# Patient Record
Sex: Male | Born: 1960 | Race: White | Hispanic: No | Marital: Married | Smoking: Former smoker
Health system: Southern US, Community
[De-identification: ages and names within clinical notes are randomized; demographics above are authoritative.]

## PROBLEM LIST (undated history)

## (undated) DIAGNOSIS — E785 Hyperlipidemia, unspecified: Secondary | ICD-10-CM

## (undated) DIAGNOSIS — K219 Gastro-esophageal reflux disease without esophagitis: Secondary | ICD-10-CM

## (undated) DIAGNOSIS — K635 Polyp of colon: Secondary | ICD-10-CM

## (undated) DIAGNOSIS — I2510 Atherosclerotic heart disease of native coronary artery without angina pectoris: Secondary | ICD-10-CM

## (undated) DIAGNOSIS — J302 Other seasonal allergic rhinitis: Secondary | ICD-10-CM

## (undated) DIAGNOSIS — I10 Essential (primary) hypertension: Secondary | ICD-10-CM

## (undated) HISTORY — PX: CLAVICLE SURGERY: SHX598

## (undated) HISTORY — PX: RHINOPLASTY: SUR1284

## (undated) HISTORY — PX: COLONOSCOPY: SHX174

## (undated) HISTORY — DX: Hyperlipidemia, unspecified: E78.5

## (undated) HISTORY — DX: Polyp of colon: K63.5

## (undated) HISTORY — DX: Other seasonal allergic rhinitis: J30.2

## (undated) HISTORY — PX: ANKLE SURGERY: SHX546

## (undated) HISTORY — PX: ROTATOR CUFF REPAIR: SHX139

---

## 2008-12-03 ENCOUNTER — Ambulatory Visit: Payer: Self-pay | Admitting: General Practice

## 2013-01-19 ENCOUNTER — Emergency Department: Payer: Self-pay | Admitting: Emergency Medicine

## 2013-05-21 ENCOUNTER — Ambulatory Visit: Payer: Self-pay | Admitting: Gastroenterology

## 2013-05-24 LAB — PATHOLOGY REPORT

## 2014-10-06 DIAGNOSIS — Z889 Allergy status to unspecified drugs, medicaments and biological substances status: Secondary | ICD-10-CM | POA: Insufficient documentation

## 2016-08-09 ENCOUNTER — Other Ambulatory Visit: Payer: Self-pay | Admitting: Family Medicine

## 2016-08-09 DIAGNOSIS — Z8249 Family history of ischemic heart disease and other diseases of the circulatory system: Secondary | ICD-10-CM

## 2016-08-16 ENCOUNTER — Ambulatory Visit: Payer: Self-pay

## 2016-08-23 ENCOUNTER — Telehealth: Payer: Self-pay

## 2016-08-23 NOTE — Telephone Encounter (Signed)
Patient calling in to verify upcoming stress test tomorrow. Reviewed medications that he is currently taking simvastatin and reflux along with some other supplements. No blood pressure or heart medications. He verbalized understanding to arrive 15 minutes early to register and wear comfortable clothing. He had no further questions at this time and was appreciative for my time.

## 2016-08-24 ENCOUNTER — Ambulatory Visit (INDEPENDENT_AMBULATORY_CARE_PROVIDER_SITE_OTHER): Payer: BLUE CROSS/BLUE SHIELD

## 2016-08-24 DIAGNOSIS — Z8249 Family history of ischemic heart disease and other diseases of the circulatory system: Secondary | ICD-10-CM

## 2016-08-24 LAB — EXERCISE TOLERANCE TEST
CSEPPHR: 146 {beats}/min
Estimated workload: 12 METS
Exercise duration (min): 10 min
Exercise duration (sec): 11 s
MPHR: 164 {beats}/min
Percent HR: 89 %
Rest HR: 58 {beats}/min

## 2018-05-07 ENCOUNTER — Other Ambulatory Visit: Payer: Self-pay

## 2018-05-08 LAB — CMP12+LP+TP+TSH+6AC+PSA+CBC…
A/G RATIO: 1.7 (ref 1.2–2.2)
ALBUMIN: 4.4 g/dL (ref 3.8–4.9)
ALK PHOS: 72 IU/L (ref 39–117)
ALT: 25 IU/L (ref 0–44)
AST: 22 IU/L (ref 0–40)
BASOS ABS: 0.1 10*3/uL (ref 0.0–0.2)
BUN/Creatinine Ratio: 14 (ref 9–20)
BUN: 12 mg/dL (ref 6–24)
Basos: 1 %
Bilirubin Total: 0.2 mg/dL (ref 0.0–1.2)
CHOLESTEROL TOTAL: 168 mg/dL (ref 100–199)
Calcium: 9.3 mg/dL (ref 8.7–10.2)
Chloride: 101 mmol/L (ref 96–106)
Chol/HDL Ratio: 3 ratio (ref 0.0–5.0)
Creatinine, Ser: 0.86 mg/dL (ref 0.76–1.27)
EOS (ABSOLUTE): 0 10*3/uL (ref 0.0–0.4)
EOS: 0 %
Estimated CHD Risk: <0.5 times avg. (ref 0.0–1.0)
FREE THYROXINE INDEX: 1.9 (ref 1.2–4.9)
GFR calc non Af Amer: 96 mL/min/{1.73_m2} (ref >59)
GFR, EST AFRICAN AMERICAN: 110 mL/min/{1.73_m2} (ref >59)
GGT: 16 IU/L (ref 0–65)
Globulin, Total: 2.6 g/dL (ref 1.5–4.5)
Glucose: 114 mg/dL — ABNORMAL HIGH (ref 65–99)
HDL: 56 mg/dL (ref >39)
HEMOGLOBIN: 14.2 g/dL (ref 13.0–17.7)
Hematocrit: 40 % (ref 37.5–51.0)
IRON: 134 ug/dL (ref 38–169)
Immature Grans (Abs): 0 10*3/uL (ref 0.0–0.1)
Immature Granulocytes: 0 %
LDH: 182 IU/L (ref 121–224)
LDL CALC: 99 mg/dL (ref 0–99)
LYMPHS ABS: 1.4 10*3/uL (ref 0.7–3.1)
Lymphs: 18 %
MCH: 32.8 pg (ref 26.6–33.0)
MCHC: 35.5 g/dL (ref 31.5–35.7)
MCV: 92 fL (ref 79–97)
MONOCYTES: 9 %
Monocytes Absolute: 0.7 10*3/uL (ref 0.1–0.9)
Neutrophils Absolute: 5.5 10*3/uL (ref 1.4–7.0)
Neutrophils: 72 %
PLATELETS: 285 10*3/uL (ref 150–450)
PROSTATE SPECIFIC AG, SERUM: 0.7 ng/mL (ref 0.0–4.0)
Phosphorus: 2.4 mg/dL — ABNORMAL LOW (ref 2.8–4.1)
Potassium: 4.2 mmol/L (ref 3.5–5.2)
RBC: 4.33 x10E6/uL (ref 4.14–5.80)
RDW: 12.3 % (ref 11.6–15.4)
Sodium: 138 mmol/L (ref 134–144)
T3 Uptake Ratio: 25 % (ref 24–39)
T4, Total: 7.4 ug/dL (ref 4.5–12.0)
TOTAL PROTEIN: 7 g/dL (ref 6.0–8.5)
TRIGLYCERIDES: 65 mg/dL (ref 0–149)
TSH: 1.01 u[IU]/mL (ref 0.450–4.500)
Uric Acid: 5.8 mg/dL (ref 3.7–8.6)
VLDL CHOLESTEROL CAL: 13 mg/dL (ref 5–40)
WBC: 7.6 10*3/uL (ref 3.4–10.8)

## 2018-09-10 ENCOUNTER — Other Ambulatory Visit: Payer: Self-pay

## 2018-09-10 MED ORDER — PANTOPRAZOLE SODIUM 40 MG PO TBEC: 40.0000 mg | DELAYED_RELEASE_TABLET | Freq: Every day | ORAL | 1 refills | Status: DC

## 2018-10-16 ENCOUNTER — Encounter: Payer: Self-pay | Admitting: Emergency Medicine

## 2018-10-16 ENCOUNTER — Emergency Department: Payer: 59

## 2018-10-16 ENCOUNTER — Other Ambulatory Visit: Payer: Self-pay

## 2018-10-16 ENCOUNTER — Telehealth: Payer: Self-pay | Admitting: Internal Medicine

## 2018-10-16 ENCOUNTER — Emergency Department
Admission: EM | Admit: 2018-10-16 | Discharge: 2018-10-16 | Disposition: A | Discharge status: Home / Self Care | Payer: 59 | Attending: Student in an Organized Health Care Education/Training Program | Admitting: Student in an Organized Health Care Education/Training Program

## 2018-10-16 DIAGNOSIS — I10 Essential (primary) hypertension: Secondary | ICD-10-CM | POA: Insufficient documentation

## 2018-10-16 DIAGNOSIS — R070 Pain in throat: Secondary | ICD-10-CM | POA: Diagnosis not present

## 2018-10-16 DIAGNOSIS — R0602 Shortness of breath: Secondary | ICD-10-CM

## 2018-10-16 DIAGNOSIS — Z87891 Personal history of nicotine dependence: Secondary | ICD-10-CM | POA: Insufficient documentation

## 2018-10-16 DIAGNOSIS — Z20828 Contact with and (suspected) exposure to other viral communicable diseases: Secondary | ICD-10-CM | POA: Diagnosis not present

## 2018-10-16 DIAGNOSIS — I259 Chronic ischemic heart disease, unspecified: Secondary | ICD-10-CM | POA: Insufficient documentation

## 2018-10-16 HISTORY — DX: Gastro-esophageal reflux disease without esophagitis: K21.9

## 2018-10-16 HISTORY — DX: Atherosclerotic heart disease of native coronary artery without angina pectoris: I25.10

## 2018-10-16 HISTORY — DX: Essential (primary) hypertension: I10

## 2018-10-16 LAB — BASIC METABOLIC PANEL
Anion gap: 7 (ref 5–15)
BUN: 14 mg/dL (ref 6–20)
CO2: 26 mmol/L (ref 22–32)
Calcium: 9.4 mg/dL (ref 8.9–10.3)
Chloride: 105 mmol/L (ref 98–111)
Creatinine, Ser: 0.74 mg/dL (ref 0.61–1.24)
GFR calc Af Amer: >60 mL/min (ref >60)
GFR calc non Af Amer: >60 mL/min (ref >60)
Glucose, Bld: 79 mg/dL (ref 70–99)
Potassium: 3.7 mmol/L (ref 3.5–5.1)
Sodium: 138 mmol/L (ref 135–145)

## 2018-10-16 LAB — TROPONIN I (HIGH SENSITIVITY)
Troponin I (High Sensitivity): 2 ng/L (ref <18)
Troponin I (High Sensitivity): <2 ng/L (ref <18)

## 2018-10-16 LAB — CBC
HCT: 44.1 % (ref 39.0–52.0)
Hemoglobin: 14.7 g/dL (ref 13.0–17.0)
MCH: 32.5 pg (ref 26.0–34.0)
MCHC: 33.3 g/dL (ref 30.0–36.0)
MCV: 97.4 fL (ref 80.0–100.0)
Platelets: 269 10*3/uL (ref 150–400)
RBC: 4.53 MIL/uL (ref 4.22–5.81)
RDW: 12.5 % (ref 11.5–15.5)
WBC: 8.5 10*3/uL (ref 4.0–10.5)
nRBC: 0 % (ref 0.0–0.2)

## 2018-10-16 LAB — SARS CORONAVIRUS 2 BY RT PCR (HOSPITAL ORDER, PERFORMED IN ~~LOC~~ HOSPITAL LAB): SARS Coronavirus 2: NEGATIVE

## 2018-10-16 LAB — FIBRIN DERIVATIVES D-DIMER (ARMC ONLY): Fibrin derivatives D-dimer (ARMC): 363.78 ng/mL (FEU) (ref 0.00–499.00)

## 2018-10-16 MED ORDER — ALBUTEROL SULFATE HFA 108 (90 BASE) MCG/ACT IN AERS: 2.0000 | INHALATION_SPRAY | Freq: Four times a day (QID) | RESPIRATORY_TRACT | 0 refills | Status: DC | PRN

## 2018-10-16 MED ORDER — IPRATROPIUM-ALBUTEROL 0.5-2.5 (3) MG/3ML IN SOLN
3.0000 mL | Freq: Once | RESPIRATORY_TRACT | Status: AC
  Administered 2018-10-16: 14:00:00 3 mL via RESPIRATORY_TRACT
  Filled 2018-10-16: qty 3

## 2018-10-16 NOTE — Telephone Encounter (Signed)
Labored breathing started early AM yesterday and got worse as day went on, EMS came O2 100, BP 110/80, BS 128.   He that as of today breathing still labored and feels like he cant catch his breath.   No other sx or exposure. He did go to [employer] last Sat. 10/13/2018, went shopping and wore a mask.   No meds for SX.

## 2018-10-16 NOTE — ED Notes (Signed)
ED Provider at bedside. 

## 2018-10-16 NOTE — Telephone Encounter (Signed)
His sm started yesterday while he was working out. Once he got a fan on him everything subsided. Then he went home and mowed and it all came back. He had to call a Ambulance yestarday because he couldn't breathe and had pressure on his chest and started panicking. EMS did an EKG and they said it was fine. They also checked his sugars, pulse, O2, and BP. They said everything was checking out fine.  Retired Lives with spouse.  In the last 24 hours have you experienced any of these symptoms  Difficulty breathing yes  Nasal congestion no  New cough no  Runny nose no  Shortness of breath yes  New sore throat no  Unexplained body aches no  Nausea or vomiting no  Diarrhea no  Loss of taste or smell no  Fever (temp>100 F/37.8 C) or chills    Exposure:   Have you been in close contact with someone with confirmed diagnosis of COVID or person under going testing in past 14 days?  no   Have you been tested for COVID? If yes date, location, results in known no   Have you been living in the same home as a person with confirmed diagnosis of COVID or a person undergoing testing? (household contact) no   Have you been diagnosed with COVID or are you waiting on test results? no   Have you traveled anywhere in the past 4 weeks? If yes where [facility]

## 2018-10-16 NOTE — Discharge Instructions (Signed)
Follow up with your PCP.  Return for any additional questions or concerns.

## 2018-10-16 NOTE — ED Provider Notes (Signed)
[facility] Emergency Department Provider Note    First MD Initiated Contact with Patient 10/16/18 1308     (approximate)  I have reviewed the triage vital signs and the nursing notes.   HISTORY  Chief Complaint Shortness of Breath    HPI Richard Walton is a 58 y.o. male below listed past medical history presents the ER with several episodes over the past 24 to 36 hours of shortness of breath.  States he was lifting weights yesterday and felt like something was stuck in his throat and that he could not get air.  EMS was called.  States he was feeling like he was having clear his throat.  Symptoms lasted several minutes.  EMS evaluated patient his oxygen level was normal vitals were normal and patient was feeling better so he declined transfer.  States he had 2 or 3 more episodes since then and decided to come to the ER for evaluation.  Was recently traveling to [facility].  Has not been around anyone in direct contact with known COVID-19.  He did not have any nausea or vomiting.  Denies any exposures to any chemicals.  No new foods.  Denied any rash.  No measured fevers.  Did feel like he was going into a panic when he had these symptoms.  Does have a remote smoking history history of bronchitis.  Currently symptom free.    Past Medical History:  Diagnosis Date  . Coronary artery disease   . GERD (gastroesophageal reflux disease)   . Hypertension    No family history on file. History reviewed. No pertinent surgical history. There are no active problems to display for this patient.     Prior to Admission medications   Medication Sig Start Date End Date Taking? Authorizing Provider  albuterol (VENTOLIN HFA) 108 (90 Base) MCG/ACT inhaler Inhale 2 puffs into the lungs every 6 (six) hours as needed for wheezing or shortness of breath. 10/16/18   Merlyn Lot, MD  pantoprazole (PROTONIX) 40 MG tablet Take 1 tablet (40 mg total) by mouth daily. 09/10/18    Towanda Malkin, MD    Allergies Patient has no known allergies.    Social History Social History   Tobacco Use  . Smoking status: Former Smoker    Types: Cigarettes  . Smokeless tobacco: Never Used  Substance Use Topics  . Alcohol use: Yes    Frequency: Never  . Drug use: Yes    Types: Marijuana    Review of Systems Patient denies headaches, rhinorrhea, blurry vision, numbness, shortness of breath, chest pain, edema, cough, abdominal pain, nausea, vomiting, diarrhea, dysuria, fevers, rashes or hallucinations unless otherwise stated above in HPI. ____________________________________________   PHYSICAL EXAM:  VITAL SIGNS: Vitals:   10/16/18 1207 10/16/18 1400  BP: 139/79 130/84  Pulse: (!) 56 (!) 58  Resp: 17 16  Temp: 99 F (37.2 C)   SpO2: 98% 98%    Constitutional: Alert and oriented.  Eyes: Conjunctivae are normal.  Head: Atraumatic. Nose: No congestion/rhinnorhea. Mouth/Throat: Mucous membranes are moist.   Neck: No stridor. Painless ROM.  Cardiovascular: Normal rate, regular rhythm. Grossly normal heart sounds.  Good peripheral circulation. Respiratory: Normal respiratory effort.  No retractions. Lungs CTAB. Gastrointestinal: Soft and nontender. No distention. No abdominal bruits. No CVA tenderness. Genitourinary:  Musculoskeletal: No lower extremity tenderness nor edema.  No joint effusions. Neurologic:  Normal speech and language. No gross focal neurologic deficits are appreciated. No facial droop Skin:  Skin is  warm, dry and intact. No rash noted. Psychiatric: Mood and affect are normal. Speech and behavior are normal.  ____________________________________________   LABS (all labs ordered are listed, but only abnormal results are displayed)  Results for orders placed or performed during the hospital encounter of 10/16/18 (from the past 24 hour(s))  Basic metabolic panel     Status: None   Collection Time: 10/16/18 12:12 PM  Result Value  Ref Range   Sodium 138 135 - 145 mmol/L   Potassium 3.7 3.5 - 5.1 mmol/L   Chloride 105 98 - 111 mmol/L   CO2 26 22 - 32 mmol/L   Glucose, Bld 79 70 - 99 mg/dL   BUN 14 6 - 20 mg/dL   Creatinine, Ser 8.110.74 0.61 - 1.24 mg/dL   Calcium 9.4 8.9 - 91.410.3 mg/dL   GFR calc non Af Amer >60 >60 mL/min   GFR calc Af Amer >60 >60 mL/min   Anion gap 7 5 - 15  CBC     Status: None   Collection Time: 10/16/18 12:12 PM  Result Value Ref Range   WBC 8.5 4.0 - 10.5 K/uL   RBC 4.53 4.22 - 5.81 MIL/uL   Hemoglobin 14.7 13.0 - 17.0 g/dL   HCT 78.244.1 95.639.0 - 21.352.0 %   MCV 97.4 80.0 - 100.0 fL   MCH 32.5 26.0 - 34.0 pg   MCHC 33.3 30.0 - 36.0 g/dL   RDW 08.612.5 57.811.5 - 46.915.5 %   Platelets 269 150 - 400 K/uL   nRBC 0.0 0.0 - 0.2 %  Troponin I (High Sensitivity)     Status: None   Collection Time: 10/16/18 12:12 PM  Result Value Ref Range   Troponin I (High Sensitivity) 2 <18 ng/L  Fibrin derivatives D-Dimer ([facility] only)     Status: None   Collection Time: 10/16/18  1:27 PM  Result Value Ref Range   Fibrin derivatives D-dimer ([facility]) 363.78 0.00 - 499.00 ng/mL (FEU)  SARS Coronavirus 2 [facility](Hospital order, Performed in [facility] hospital lab) Nasopharyngeal Nasopharyngeal Swab     Status: None   Collection Time: 10/16/18  1:27 PM   Specimen: Nasopharyngeal Swab  Result Value Ref Range   SARS Coronavirus 2 NEGATIVE NEGATIVE  Troponin I (High Sensitivity)     Status: None   Collection Time: 10/16/18  2:24 PM  Result Value Ref Range   Troponin I (High Sensitivity) <2 <18 ng/L   ____________________________________________  EKG My review and personal interpretation at Time: 12:03   Indication: sob  Rate: 60  Rhythm: sinus Axis: normal Other: normal intervals, no stemi ____________________________________________  RADIOLOGY  I personally reviewed all radiographic images ordered to evaluate for the above acute complaints and reviewed radiology reports and findings.  These findings were personally discussed  with the patient.  Please see medical record for radiology report.  ____________________________________________   PROCEDURES  Procedure(s) performed:  Procedures    Critical Care performed: no ____________________________________________   INITIAL IMPRESSION / ASSESSMENT AND PLAN / ED COURSE  Pertinent labs & imaging results that were available during my care of the patient were reviewed by me and considered in my medical decision making (see chart for details).   DDX: Asthma, copd, CHF, pna, ptx, malignancy, Pe, anemia   Richard Walton is a 58 y.o. who presents to the ED with was as described above.  Patient well-appearing currently afebrile hemodynamically stable.  EKG shows no evidence of acute ischemia.  Patient reported history of CAD but otherwise  very healthy and active.  Low risk by heart score.  Serial enzymes show essentially undetectable troponin.  This does not seem clinically consistent with ACS.  D-dimer was negative.  And he is low risk by Wells criteria.  Chest x-ray shows no evidence of pneumothorax infiltrates or other abnormality.  He does not have any evidence of uvular edema.  Normal phonation.  Possible bronchospasm.  Abdominal exam is soft and benign.  Patient stable and appropriate for outpatient follow-up.     The patient was evaluated in Emergency Department today for the symptoms described in the history of present illness. He/she was evaluated in the context of the global COVID-19 pandemic, which necessitated consideration that the patient might be at risk for infection with the SARS-CoV-2 virus that causes COVID-19. Institutional protocols and algorithms that pertain to the evaluation of patients at risk for COVID-19 are in a state of rapid change based on information released by regulatory bodies including the CDC and federal and state organizations. These policies and algorithms were followed during the patient's care in the ED.  As part of my medical  decision making, I reviewed the following data within the electronic MEDICAL RECORD NUMBER Nursing notes reviewed and incorporated, Labs reviewed, notes from prior ED visits and [place] Controlled Substance Database   ____________________________________________   FINAL CLINICAL IMPRESSION(S) / ED DIAGNOSES  Final diagnoses:  SOB (shortness of breath)      NEW MEDICATIONS STARTED DURING THIS VISIT:  New Prescriptions   ALBUTEROL (VENTOLIN HFA) 108 (90 BASE) MCG/ACT INHALER    Inhale 2 puffs into the lungs every 6 (six) hours as needed for wheezing or shortness of breath.     Note:  This document was prepared using Dragon voice recognition software and may include unintentional dictation errors.    Willy Eddyobinson, Joni Norrod, MD 10/16/18 1505

## 2018-10-16 NOTE — Telephone Encounter (Signed)
Message reviewed.  Called patient back and he noted he decided to go to the ER at [facility] and I felt that was probably a good idea with the history shared to me (EMS had done some tests on him yesterday when called to his place and were all ok, but his sx's were recurring some today). He is there now and had an ECG done, a CXR and some labs drawn and awaiting results.  Await that evaluation.

## 2018-10-16 NOTE — ED Notes (Signed)
Pt alert and oriented X 4, stable for discharge. RR even and unlabored, color WNL. Discussed discharge instructions and follow up when appropriate. Instructed to follow up with ER for any life threatening symptoms or concerns of patient and/or family.

## 2018-10-16 NOTE — ED Triage Notes (Signed)
Pt in via POV reports shortness of breath x 2 days, some chest tightness also.  Ambulatory to triage, NAD noted at this time.

## 2018-10-17 NOTE — Telephone Encounter (Addendum)
Pt went to Er yesterday and dx was SOB. See note in Epic.   Called pt he states that he is still kinda freaked out about how he is feeling. ER gave him an albuterol inhaler. States he is just taking it easy and trying to over due.    Pt states that ER says that ER mentioned his reflux but he states that he doesn't feel like its his reflux.

## 2018-10-17 NOTE — Telephone Encounter (Signed)
Reviewed ER note.  If symptoms not improving over the next couple days after the ER visit and w/u done (re-assuring), can make a f/u appt to assess (next week ok as will have coverage here in my absence).

## 2018-10-17 NOTE — Telephone Encounter (Signed)
Pt aware.

## 2018-10-19 ENCOUNTER — Telehealth: Payer: Self-pay | Admitting: Internal Medicine

## 2018-10-19 NOTE — Telephone Encounter (Signed)
He said he is feeling burning in his throat and chest. He also said that his heart burn is really bad. ER suggested that he may need to change his reflux meds.

## 2018-10-19 NOTE — Telephone Encounter (Signed)
Spoke with [employer].  States he's having increase in belching.  Discussed eating habits & he uses a lot of hot sauce on food.  Advised to decrease fluid intake prior to bedtime, elevate head with extra pillow or elevate head of bed and decrease caffeine.  Michela Pitcher he will be OK to wait till Monday for evaluation.  Appt scheduled for 10/22/2018 at 10:15am.  AMD

## 2018-10-22 ENCOUNTER — Other Ambulatory Visit: Payer: Self-pay

## 2018-10-22 ENCOUNTER — Encounter: Payer: Self-pay | Admitting: Internal Medicine

## 2018-10-22 ENCOUNTER — Ambulatory Visit: Payer: Self-pay | Admitting: Registered Nurse

## 2018-10-22 VITALS — BP 118/67 | HR 75 | Temp 98.9°F | Resp 14 | Ht 70.0 in | Wt 167.0 lb

## 2018-10-22 DIAGNOSIS — E785 Hyperlipidemia, unspecified: Secondary | ICD-10-CM | POA: Insufficient documentation

## 2018-10-22 DIAGNOSIS — K219 Gastro-esophageal reflux disease without esophagitis: Secondary | ICD-10-CM | POA: Insufficient documentation

## 2018-10-22 MED ORDER — PANTOPRAZOLE SODIUM 40 MG PO TBEC: 40.0000 mg | DELAYED_RELEASE_TABLET | Freq: Two times a day (BID) | ORAL | 0 refills | Status: DC

## 2018-10-22 NOTE — Patient Instructions (Signed)
Bronchospasm, Adult    Bronchospasm is a tightening of the airways going into the lungs. During an episode, it may be harder to breathe. You may cough, and you may make a whistling sound when you breathe (wheeze).  This condition often affects people with asthma.  What are the causes?  This condition is caused by swelling and irritation in the airways. It can be triggered by:  · An infection (common).  · Seasonal allergies.  · An allergic reaction.  · Exercise.  · Irritants. These include pollution, cigarette smoke, strong odors, aerosol sprays, and paint fumes.  · Weather changes. Winds increase molds and pollens in the air. Cold air may cause swelling.  · Stress and emotional upset.  What are the signs or symptoms?  Symptoms of this condition include:  · Wheezing. If the episode was triggered by an allergy, wheezing may start right away or hours later.  · Nighttime coughing.  · Frequent or severe coughing with a simple cold.  · Chest tightness.  · Shortness of breath.  · Decreased ability to exercise.  How is this diagnosed?  This condition is usually diagnosed with a review of your medical history and a physical exam. Tests, such as lung function tests, are sometimes done to look for other conditions. The need for a chest X-ray depends on where the wheezing occurs and whether it is the first time you have wheezed.  How is this treated?  This condition may be treated with:  · Inhaled medicines. These open up the airways and help you breathe. They can be taken with an inhaler or a nebulizer device.  · Corticosteroid medicines. These may be given for severe bronchospasm, usually when it is associated with asthma.  · Avoiding triggers, such as irritants, infection, or allergies.  Follow these instructions at home:  Medicines  · Take over-the-counter and prescription medicines only as told by your health care provider.  · If you need to use an inhaler or nebulizer to take your medicine, ask your health care provider  to explain how to use it correctly. If you were given a spacer, always use it with your inhaler.  Lifestyle  · Reduce the number of triggers in your home. To do this:  ? Change your heating and air conditioning filter at least once a month.  ? Limit your use of fireplaces and wood stoves.  ? Do not smoke. Do not allow smoking in your home.  ? Avoid using perfumes and fragrances.  ? Get rid of pests, such as roaches and mice, and their droppings.  ? Remove any mold from your home.  ? Keep your house clean and dust free. Use unscented cleaning products.  ? Replace carpet with wood, tile, or vinyl flooring. Carpet can trap dander and dust.  ? Use allergy-proof pillows, mattress covers, and box spring covers.  ? Wash bed sheets and blankets every week in hot water. Dry them in a dryer.  ? Use blankets that are made of polyester or cotton.  ? Wash your hands often.  ? Do not allow pets in your bedroom.  · Avoid breathing in cold air when you exercise.  General instructions  · Have a plan for seeking medical care. Know when to call your health care provider and local emergency services, and where to get emergency care.  · Stay up to date on your immunizations.  · When you have an episode of bronchospasm, stay calm. Try to relax and breathe more slowly.  ·   sputum) changes from clear or white to yellow, green, gray, or bloody.  You have a fever.  Your sputum gets thicker. Get help right away if:  Your wheezing and coughing get worse, even after you take your prescribed medicines.  It gets even harder to breathe.  You develop severe chest pain. Summary  Bronchospasm is a tightening of the airways going into the lungs.  During an episode of  bronchospasm, you may have a harder time breathing. You may cough and make a whistling sound when you breathe (wheeze).  Avoid exposure to triggers such as smoke, dust, mold, animal dander, and fragrances.  When you have an episode of bronchospasm, stay calm. Try to relax and breathe more slowly. This information is not intended to replace advice given to you by your health care provider. Make sure you discuss any questions you have with your health care provider. Document Released: 02/17/2003 Document Revised: 01/27/2017 Document Reviewed: 02/11/2016 Elsevier Patient Education  2020 [employer] Inc. Gastroesophageal Reflux Disease, Adult Gastroesophageal reflux (GER) happens when acid from the stomach flows up into the tube that connects the mouth and the stomach (esophagus). Normally, food travels down the esophagus and stays in the stomach to be digested. However, when a person has GER, food and stomach acid sometimes move back up into the esophagus. If this becomes a more serious problem, the person may be diagnosed with a disease called gastroesophageal reflux disease (GERD). GERD occurs when the reflux:  Happens often.  Causes frequent or severe symptoms.  Causes problems such as damage to the esophagus. When stomach acid comes in contact with the esophagus, the acid may cause soreness (inflammation) in the esophagus. Over time, GERD may create small holes (ulcers) in the lining of the esophagus. What are the causes? This condition is caused by a problem with the muscle between the esophagus and the stomach (lower esophageal sphincter, or LES). Normally, the LES muscle closes after food passes through the esophagus to the stomach. When the LES is weakened or abnormal, it does not close properly, and that allows food and stomach acid to go back up into the esophagus. The LES can be weakened by certain dietary substances, medicines, and medical conditions, including:  Tobacco  use.  Pregnancy.  Having a hiatal hernia.  Alcohol use.  Certain foods and beverages, such as coffee, chocolate, onions, and peppermint. What increases the risk? You are more likely to develop this condition if you:  Have an increased body weight.  Have a connective tissue disorder.  Use NSAID medicines. What are the signs or symptoms? Symptoms of this condition include:  Heartburn.  Difficult or painful swallowing.  The feeling of having a lump in the throat.  Abitter taste in the mouth.  Bad breath.  Having a large amount of saliva.  Having an upset or bloated stomach.  Belching.  Chest pain. Different conditions can cause chest pain. Make sure you see your health care provider if you experience chest pain.  Shortness of breath or wheezing.  Ongoing (chronic) cough or a night-time cough.  Wearing away of tooth enamel.  Weight loss. How is this diagnosed? Your health care provider will take a medical history and perform a physical exam. To determine if you have mild or severe GERD, your health care provider may also monitor how you respond to treatment. You may also have tests, including:  A test to examine your stomach and esophagus with a small camera (endoscopy).  A test thatmeasures the acidity level  in your esophagus.  A test thatmeasures how much pressure is on your esophagus.  A barium swallow or modified barium swallow test to show the shape, size, and functioning of your esophagus. How is this treated? The goal of treatment is to help relieve your symptoms and to prevent complications. Treatment for this condition may vary depending on how severe your symptoms are. Your health care provider may recommend:  Changes to your diet.  Medicine.  Surgery. Follow these instructions at home: Eating and drinking   Follow a diet as recommended by your health care provider. This may involve avoiding foods and drinks such as: ? Coffee and tea (with  or without caffeine). ? Drinks that containalcohol. ? Energy drinks and sports drinks. ? Carbonated drinks or sodas. ? Chocolate and cocoa. ? Peppermint and mint flavorings. ? Garlic and onions. ? Horseradish. ? Spicy and acidic foods, including peppers, chili powder, curry powder, vinegar, hot sauces, and barbecue sauce. ? Citrus fruit juices and citrus fruits, such as oranges, lemons, and limes. ? Tomato-based foods, such as red sauce, chili, salsa, and pizza with red sauce. ? Fried and fatty foods, such as donuts, french fries, potato chips, and high-fat dressings. ? High-fat meats, such as hot dogs and fatty cuts of red and white meats, such as rib eye steak, sausage, ham, and bacon. ? High-fat dairy items, such as whole milk, butter, and cream cheese.  Eat small, frequent meals instead of large meals.  Avoid drinking large amounts of liquid with your meals.  Avoid eating meals during the 2-3 hours before bedtime.  Avoid lying down right after you eat.  Do not exercise right after you eat. Lifestyle   Do not use any products that contain nicotine or tobacco, such as cigarettes, e-cigarettes, and chewing tobacco. If you need help quitting, ask your health care provider.  Try to reduce your stress by using methods such as yoga or meditation. If you need help reducing stress, ask your health care provider.  If you are overweight, reduce your weight to an amount that is healthy for you. Ask your health care provider for guidance about a safe weight loss goal. General instructions  Pay attention to any changes in your symptoms.  Take over-the-counter and prescription medicines only as told by your health care provider. Do not take aspirin, ibuprofen, or other NSAIDs unless your health care provider told you to do so.  Wear loose-fitting clothing. Do not wear anything tight around your waist that causes pressure on your abdomen.  Raise (elevate) the head of your bed about 6  inches (15 cm).  Avoid bending over if this makes your symptoms worse.  Keep all follow-up visits as told by your health care provider. This is important. Contact a health care provider if:  You have: ? New symptoms. ? Unexplained weight loss. ? Difficulty swallowing or it hurts to swallow. ? Wheezing or a persistent cough. ? A hoarse voice.  Your symptoms do not improve with treatment. Get help right away if you:  Have pain in your arms, neck, jaw, teeth, or back.  Feel sweaty, dizzy, or light-headed.  Have chest pain or shortness of breath.  Vomit and your vomit looks like blood or coffee grounds.  Faint.  Have stool that is bloody or black.  Cannot swallow, drink, or eat. Summary  Gastroesophageal reflux happens when acid from the stomach flows up into the esophagus. GERD is a disease in which the reflux happens often, causes frequent or severe  symptoms, or causes problems such as damage to the esophagus.  Treatment for this condition may vary depending on how severe your symptoms are. Your health care provider may recommend diet and lifestyle changes, medicine, or surgery.  Contact a health care provider if you have new or worsening symptoms.  Take over-the-counter and prescription medicines only as told by your health care provider. Do not take aspirin, ibuprofen, or other NSAIDs unless your health care provider told you to do so.  Keep all follow-up visits as told by your health care provider. This is important. This information is not intended to replace advice given to you by your health care provider. Make sure you discuss any questions you have with your health care provider. Document Released: 11/24/2004 Document Revised: 08/23/2017 Document Reviewed: 08/23/2017 Elsevier Patient Education  2020 [employer]

## 2018-10-22 NOTE — Progress Notes (Signed)
Subjective:    Patient ID: Richard Walton, male    DOB: 05-Jan-1961, 58 y.o.   MRN: [id number]  57y/o married caucasian male established patient with heartburn worsening on protonix and started doubling his dose last week and added tums afternoons stopped baking soda per recommendation of his neighbor and noticed it is helping with afternoon symptoms "feeling the throat burn" and some increased burping.  Had ambulance call/ED visit due to trouble breathing this month also.  They said everything was fine on eval.  Patient retired stated no stress and he exercises but didn't do squats this week and sometimes notices worsening with pull ups.  Hasn't noticed reflux symptoms at night since started protonix greater than 1 year ago. Likes spicy food/hot sauce it doesn't trigger symptoms but he did notice hasbrowns/eggs/sausage did in the past week. Does wear belt.  Doesn't have HOB elevated.  Denied coughing.  Had covid test negative in the past month.  Likes having mints in the evening.  Due for repeat colonoscopy due to polyps this year last 2015 and was instructed follow up 5 years.  He is going to schedule.  Lost 5 lbs in the past week due to stress.  Formerly in the [employer] before working for the [place] and retiring.  covid 19 pandemic ongoing patient wearing cloth face mask.     Review of Systems  Constitutional: Negative for activity change, appetite change, chills, diaphoresis, fatigue and fever.  HENT: Negative for trouble swallowing and voice change.   Eyes: Negative for photophobia and visual disturbance.  Respiratory: Positive for shortness of breath. Negative for cough, wheezing and stridor.   Cardiovascular: Positive for chest pain. Negative for palpitations and leg swelling.  Gastrointestinal: Negative for abdominal distention, abdominal pain, diarrhea, nausea and vomiting.  Endocrine: Negative for cold intolerance and heat intolerance.  Genitourinary: Negative for difficulty  urinating.  Musculoskeletal: Negative for neck pain and neck stiffness.  Allergic/Immunologic: Negative for food allergies.  Neurological: Negative for dizziness, tremors, seizures, syncope, facial asymmetry, speech difficulty, weakness, light-headedness, numbness and headaches.  Hematological: Negative for adenopathy. Does not bruise/bleed easily.  Psychiatric/Behavioral: Negative for agitation, confusion and sleep disturbance.       Objective:   Physical Exam Vitals signs reviewed.  Constitutional:      General: He is awake.     Appearance: Normal appearance. He is well-developed and well-groomed.  HENT:     Head: Normocephalic and atraumatic.     Jaw: There is normal jaw occlusion.     Right Ear: Hearing and external ear normal.     Left Ear: Hearing and external ear normal.     Nose: Nose normal.     Mouth/Throat:     Lips: Pink. No lesions.     Mouth: Mucous membranes are moist.     Pharynx: Oropharynx is clear. Uvula midline.  Eyes:     General: Lids are normal. No visual field deficit.    Extraocular Movements: Extraocular movements intact.     Conjunctiva/sclera: Conjunctivae normal.     Pupils: Pupils are equal, round, and reactive to light.  Neck:     Musculoskeletal: Normal range of motion and neck supple. Normal range of motion. No edema, erythema, neck rigidity, crepitus, injury, pain with movement, torticollis, spinous process tenderness or muscular tenderness.     Trachea: Trachea normal.  Cardiovascular:     Rate and Rhythm: Normal rate and regular rhythm.     Pulses: Normal pulses.  Radial pulses are 2+ on the right side and 2+ on the left side.     Heart sounds: Normal heart sounds.  Pulmonary:     Effort: Pulmonary effort is normal. No respiratory distress.     Breath sounds: Normal breath sounds and air entry. No stridor, decreased air movement or transmitted upper airway sounds. No decreased breath sounds, wheezing, rhonchi or rales.     Comments:  No cough observed in exam room; spoke full sentences without difficulty; wearing cloth mask in exam room due to covid 19 pandem[place] Chest:     Chest wall: No tenderness.  Abdominal:     General: Abdomen is flat. Bowel sounds are decreased. There is no distension.     Palpations: Abdomen is soft. There is no shifting dullness, fluid wave, hepatomegaly, splenomegaly, mass or pulsatile mass.     Tenderness: There is no abdominal tenderness. There is no right CVA tenderness, left CVA tenderness, guarding or rebound. Negative signs include Murphy's sign.     Hernia: No hernia is present. There is no hernia in the umbilical area or ventral area.     Comments: Dull to percussion x 4 quads; hypoactive bowel sounds x 4 quads  Musculoskeletal: Normal range of motion.     Right shoulder: Normal.     Left shoulder: Normal.     Right elbow: Normal.    Left elbow: Normal.     Right hip: Normal.     Left hip: Normal.     Right knee: Normal.     Left knee: Normal.     Cervical back: Normal.     Thoracic back: Normal.     Lumbar back: Normal.     Right hand: Normal.     Left hand: Normal.     Right lower leg: No edema.     Left lower leg: No edema.     Comments: Standing/sitting/supine and reverse without difficulty or pain  Lymphadenopathy:     Head:     Right side of head: No submental, submandibular, tonsillar, preauricular, posterior auricular or occipital adenopathy.     Left side of head: No submental, submandibular, tonsillar, preauricular, posterior auricular or occipital adenopathy.     Cervical: No cervical adenopathy.     Right cervical: No superficial cervical adenopathy.    Left cervical: No superficial cervical adenopathy.  Skin:    General: Skin is warm and dry.     Capillary Refill: Capillary refill takes less than 2 seconds.     Coloration: Skin is not ashen, cyanotic, jaundiced, mottled, pale or sallow.     Findings: No abrasion, abscess, acne, bruising, burn, ecchymosis,  erythema, signs of injury, laceration, lesion, petechiae, rash or wound.     Nails: There is no clubbing.   Neurological:     General: No focal deficit present.     Mental Status: He is alert and oriented to person, place, and time. Mental status is at baseline.     GCS: GCS eye subscore is 4. GCS verbal subscore is 5. GCS motor subscore is 6.     Cranial Nerves: Cranial nerves are intact. No cranial nerve deficit, dysarthria or facial asymmetry.     Sensory: Sensation is intact. No sensory deficit.     Motor: Motor function is intact. No weakness, tremor, atrophy, abnormal muscle tone or seizure activity.     Coordination: Coordination is intact. Coordination normal.     Gait: Gait is intact. Gait normal.     Comments:  Gait sure and steady in hallway; in/out of chair without difficulty  Psychiatric:        Attention and Perception: Attention and perception normal.        Mood and Affect: Mood and affect normal.        Speech: Speech normal.        Behavior: Behavior normal. Behavior is cooperative.        Thought Content: Thought content normal.        Cognition and Memory: Cognition and memory normal.        Judgment: Judgment normal.           Assessment & Plan:  A-GERD   P-Per up to date will increase protonix to 40mg  po BID #60 RF0 for refractory GERD.  Discussed since symptoms in the afternoon change from 06/1698 dosing due to 1hr half life of protonix to after breakfast to help cover afternoon better.  If no improvement will consider switching to esomeprazole or omeprazole.  Consider magnesium level as none on file in the past year.  Discussed with patient acid from stomach can cause bronchospasms/cough symptoms in addition to throat burning also.  Discussed to cut down use of peppermints as they can worsen sphincter leakage/reflux sx.  The pathophysiology of reflux is discussed. Exitcare handout on esophageal reflux given to patient. Anti-reflux measures such as raising the  head of the bed, avoiding tight clothing or belts, avoiding eating late at night and not lying down shortly after mealtime and achieving weight loss were discussed. Avoid ASA, NSAID's, caffeine, peppermints, alcohol and tobacco. OTC H2 blockers and/or antacids (Tums 1 po prn max 8gm per 24 hours) are often very helpful for PRN use. Discussed if still requiring daily tums in 2-3 weeks he should notify us and will probably require change in his PPI.  However, for chronic or daily symptoms, prescription strength H2 blockers or a trial of PPI's should be used. Patient should alert me if there are persistent symptoms, dysphagia, weight loss or GI bleeding (bright red or black). Follow up with clinic provider in 1 month if no improvement in symptoms or PCM if symptoms persist despite therapy.  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  Consider EGD if worsening sx especially worsening throat pain, changes in voice, weight loss, dyspnea despite plan of care Patient verbalized agreement and understanding of treatment plan and had no further questions at this time. P2: Diet, Food avoidance that aggravate condition, and Fitness

## 2018-10-23 ENCOUNTER — Telehealth: Payer: Self-pay

## 2018-10-23 NOTE — Telephone Encounter (Signed)
Richard Walton called stating he went to [facility] in [place] to try to pick up the Rx for Protonix 2 tabs/day that you wrote yesterday.  States Pharmacist told him that "insurance says he's had the max amount he can have for the year & can fill the Rx until 08/2019".  States prior Rx for 1 tab/day & gets a 90 day supply, so he has some but eventually will run out taking 2 per day.  The Pharmacist didn't give him any guidance as to whether a Prior Authorization needs to be completed.  [facility] patients insurance changed from [facility] to [place] on 08/29/2018.  Please advise.  AMD

## 2018-10-23 NOTE — Telephone Encounter (Signed)
Please have patient take protonix increased BID dosing 40mg  x 2 weeks then notify me if helping if not we will switch to another medication (PPI covered by his insurance) at that time.  If he does not have enough pills for two weeks trial please notify me also.

## 2018-10-24 NOTE — Telephone Encounter (Signed)
Spoke with [employer].  Gave him Tina's recommendation to use Protonix 40 mg bid x 2 wks & to call us back & let us know if it's helping or not helping.  States last time he picked up Rx he had a 90 day supply & thinks he has enough to last him the two weeks.  If helping will have resolve with insurance/pharmacy and if not helping consider switching to something else that insurance covers.  Verbalized understanding.  AMD

## 2018-10-24 NOTE — Telephone Encounter (Signed)
Noted agree

## 2018-10-25 ENCOUNTER — Other Ambulatory Visit: Payer: Self-pay

## 2018-10-25 ENCOUNTER — Emergency Department
Admission: EM | Admit: 2018-10-25 | Discharge: 2018-10-25 | Disposition: A | Discharge status: Home / Self Care | Payer: 59 | Attending: Student in an Organized Health Care Education/Training Program | Admitting: Student in an Organized Health Care Education/Training Program

## 2018-10-25 ENCOUNTER — Emergency Department: Payer: 59

## 2018-10-25 DIAGNOSIS — R0789 Other chest pain: Secondary | ICD-10-CM | POA: Diagnosis not present

## 2018-10-25 DIAGNOSIS — Z7982 Long term (current) use of aspirin: Secondary | ICD-10-CM | POA: Insufficient documentation

## 2018-10-25 DIAGNOSIS — Z79899 Other long term (current) drug therapy: Secondary | ICD-10-CM | POA: Diagnosis not present

## 2018-10-25 DIAGNOSIS — I2510 Atherosclerotic heart disease of native coronary artery without angina pectoris: Secondary | ICD-10-CM | POA: Insufficient documentation

## 2018-10-25 DIAGNOSIS — R0602 Shortness of breath: Secondary | ICD-10-CM | POA: Diagnosis not present

## 2018-10-25 DIAGNOSIS — Z87891 Personal history of nicotine dependence: Secondary | ICD-10-CM | POA: Diagnosis not present

## 2018-10-25 DIAGNOSIS — R221 Localized swelling, mass and lump, neck: Secondary | ICD-10-CM | POA: Diagnosis present

## 2018-10-25 DIAGNOSIS — R070 Pain in throat: Secondary | ICD-10-CM | POA: Diagnosis not present

## 2018-10-25 DIAGNOSIS — I10 Essential (primary) hypertension: Secondary | ICD-10-CM | POA: Insufficient documentation

## 2018-10-25 DIAGNOSIS — Z20828 Contact with and (suspected) exposure to other viral communicable diseases: Secondary | ICD-10-CM | POA: Diagnosis not present

## 2018-10-25 LAB — COMPREHENSIVE METABOLIC PANEL
ALT: 24 U/L (ref 0–44)
AST: 24 U/L (ref 15–41)
Albumin: 4.4 g/dL (ref 3.5–5.0)
Alkaline Phosphatase: 61 U/L (ref 38–126)
Anion gap: 8 (ref 5–15)
BUN: 15 mg/dL (ref 6–20)
CO2: 30 mmol/L (ref 22–32)
Calcium: 9.7 mg/dL (ref 8.9–10.3)
Chloride: 103 mmol/L (ref 98–111)
Creatinine, Ser: 0.69 mg/dL (ref 0.61–1.24)
GFR calc Af Amer: >60 mL/min (ref >60)
GFR calc non Af Amer: >60 mL/min (ref >60)
Glucose, Bld: 127 mg/dL — ABNORMAL HIGH (ref 70–99)
Potassium: 3.7 mmol/L (ref 3.5–5.1)
Sodium: 141 mmol/L (ref 135–145)
Total Bilirubin: 0.8 mg/dL (ref 0.3–1.2)
Total Protein: 8.2 g/dL — ABNORMAL HIGH (ref 6.5–8.1)

## 2018-10-25 LAB — TROPONIN I (HIGH SENSITIVITY)
Troponin I (High Sensitivity): 2 ng/L (ref <18)
Troponin I (High Sensitivity): 3 ng/L (ref <18)

## 2018-10-25 LAB — CBC WITH DIFFERENTIAL/PLATELET
Abs Immature Granulocytes: 0.03 10*3/uL (ref 0.00–0.07)
Basophils Absolute: 0.1 10*3/uL (ref 0.0–0.1)
Basophils Relative: 1 %
Eosinophils Absolute: 0 10*3/uL (ref 0.0–0.5)
Eosinophils Relative: 0 %
HCT: 44.6 % (ref 39.0–52.0)
Hemoglobin: 15.3 g/dL (ref 13.0–17.0)
Immature Granulocytes: 0 %
Lymphocytes Relative: 17 %
Lymphs Abs: 1.5 10*3/uL (ref 0.7–4.0)
MCH: 32.6 pg (ref 26.0–34.0)
MCHC: 34.3 g/dL (ref 30.0–36.0)
MCV: 95.1 fL (ref 80.0–100.0)
Monocytes Absolute: 0.7 10*3/uL (ref 0.1–1.0)
Monocytes Relative: 8 %
Neutro Abs: 6.8 10*3/uL (ref 1.7–7.7)
Neutrophils Relative %: 74 %
Platelets: 260 10*3/uL (ref 150–400)
RBC: 4.69 MIL/uL (ref 4.22–5.81)
RDW: 12.2 % (ref 11.5–15.5)
WBC: 9.2 10*3/uL (ref 4.0–10.5)
nRBC: 0 % (ref 0.0–0.2)

## 2018-10-25 MED ORDER — LORAZEPAM 1 MG PO TABS: 1.0000 mg | ORAL_TABLET | Freq: Three times a day (TID) | ORAL | 0 refills | Status: AC | PRN

## 2018-10-25 MED ORDER — LORAZEPAM 2 MG/ML IJ SOLN
1.0000 mg | Freq: Once | INTRAMUSCULAR | Status: AC
  Administered 2018-10-25: 1 mg via INTRAVENOUS
  Filled 2018-10-25: qty 1

## 2018-10-25 MED ORDER — IOHEXOL 350 MG/ML SOLN
75.0000 mL | Freq: Once | INTRAVENOUS | Status: AC | PRN
  Administered 2018-10-25: 10:00:00 75 mL via INTRAVENOUS

## 2018-10-25 NOTE — ED Provider Notes (Signed)
[facility] Emergency Department Provider Note    First MD Initiated Contact with Patient 10/25/18 [id number]     (approximate)  I have reviewed the triage vital signs and the nursing notes.   HISTORY  Chief Complaint Oral Swelling    HPI Richard Walton is a 58 y.o. male below listed past medical history presenting to the ER for evaluation of persistent episodes of feeling that his throat is tightening up and that he cannot breathe.  States he gets sweaty palms and feet with this.  Feels very anxious.  He is able to eat and drink during these episodes.  No nausea or vomiting..  No radiation of the pain.  Does have a history of gastritis and did recently increase his Protonix but does not feel any better.  Was actually in follow-up for outpatient clinic today but started having the discomfort and came to the ER for evaluation.    Past Medical History:  Diagnosis Date   Colon polyp    Coronary artery disease    GERD (gastroesophageal reflux disease)    Hyperlipidemia    Hypertension    Family History  Problem Relation Age of Onset   Heart disease Mother    Kidney disease Father    Heart disease Brother    Past Surgical History:  Procedure Laterality Date   ANKLE SURGERY     CLAVICLE SURGERY     COLONOSCOPY     RHINOPLASTY     ROTATOR CUFF REPAIR Bilateral    Patient Active Problem List   Diagnosis Date Noted   GERD (gastroesophageal reflux disease) 10/22/2018   HLD (hyperlipidemia) 10/22/2018      Prior to Admission medications   Medication Sig Start Date End Date Taking? Authorizing Provider  albuterol (VENTOLIN HFA) 108 (90 Base) MCG/ACT inhaler Inhale 2 puffs into the lungs every 6 (six) hours as needed for wheezing or shortness of breath. 10/16/18   Willy Eddy, MD  amLODipine (NORVASC) 10 MG tablet Take 10 mg by mouth daily. 09/22/18   [provider]  aspirin EC 81 MG tablet Take 81 mg by mouth daily.    [provider]  Calcium Carbonate Antacid (TUMS PO) Take by mouth.    [provider]  fluticasone (FLONASE) 50 MCG/ACT nasal spray Place into the nose.    [provider]  LORazepam (ATIVAN) 1 MG tablet Take 1 tablet (1 mg total) by mouth every 8 (eight) hours as needed for anxiety. 10/25/18 10/25/19  Willy Eddy, MD  Multiple Vitamin (MULTIVITAMIN) capsule Take 1 capsule by mouth daily.    [provider]  pantoprazole (PROTONIX) 40 MG tablet Take 1 tablet (40 mg total) by mouth 2 (two) times daily. 10/22/18 11/21/18  Betancourt, Jarold Song, NP  simvastatin (ZOCOR) 20 MG tablet Take by mouth.    [provider]    Allergies Patient has no known allergies.    Social History Social History   Tobacco Use   Smoking status: Former Smoker    Types: Cigarettes   Smokeless tobacco: Never Used  Substance Use Topics   Alcohol use: Yes    Frequency: Never   Drug use: Yes    Types: Marijuana    Review of Systems Patient denies headaches, rhinorrhea, blurry vision, numbness, shortness of breath, chest pain, edema, cough, abdominal pain, nausea, vomiting, diarrhea, dysuria, fevers, rashes or hallucinations unless otherwise stated above in HPI. ____________________________________________   PHYSICAL EXAM:  VITAL SIGNS: Vitals:   10/25/18  0930 10/25/18 1100  BP: (!) 152/87 125/82  Pulse: 62 60  Resp: 14 14  Temp:    SpO2: 94% 98%    Constitutional: Alert and oriented. No respiratory distress Eyes: Conjunctivae are normal.  Head: Atraumatic. Nose: No congestion/rhinnorhea. Mouth/Throat: Mucous membranes are moist.   Neck: No stridor. Painless ROM.  Cardiovascular: Normal rate, regular rhythm. Grossly normal heart sounds.  Good peripheral circulation. Respiratory: Normal respiratory effort.  No retractions. Lungs CTAB. Gastrointestinal: Soft and nontender. No distention. No abdominal bruits. No CVA tenderness. Genitourinary:    Musculoskeletal: No lower extremity tenderness nor edema.  No joint effusions. Neurologic:  Normal speech and language. No gross focal neurologic deficits are appreciated. No facial droop Skin:  Skin is warm, dry and intact. No rash noted. Psychiatric: Mood and affect are normal. Speech and behavior are normal.  ____________________________________________   LABS (all labs ordered are listed, but only abnormal results are displayed)  Results for orders placed or performed during the hospital encounter of 10/25/18 (from the past 24 hour(s))  CBC with Differential/Platelet     Status: None   Collection Time: 10/25/18  9:27 AM  Result Value Ref Range   WBC 9.2 4.0 - 10.5 K/uL   RBC 4.69 4.22 - 5.81 MIL/uL   Hemoglobin 15.3 13.0 - 17.0 g/dL   HCT 16.144.6 09.639.0 - 04.552.0 %   MCV 95.1 80.0 - 100.0 fL   MCH 32.6 26.0 - 34.0 pg   MCHC 34.3 30.0 - 36.0 g/dL   RDW 40.912.2 81.111.5 - 91.415.5 %   Platelets 260 150 - 400 K/uL   nRBC 0.0 0.0 - 0.2 %   Neutrophils Relative % 74 %   Neutro Abs 6.8 1.7 - 7.7 K/uL   Lymphocytes Relative 17 %   Lymphs Abs 1.5 0.7 - 4.0 K/uL   Monocytes Relative 8 %   Monocytes Absolute 0.7 0.1 - 1.0 K/uL   Eosinophils Relative 0 %   Eosinophils Absolute 0.0 0.0 - 0.5 K/uL   Basophils Relative 1 %   Basophils Absolute 0.1 0.0 - 0.1 K/uL   Immature Granulocytes 0 %   Abs Immature Granulocytes 0.03 0.00 - 0.07 K/uL  Comprehensive metabolic panel     Status: Abnormal   Collection Time: 10/25/18  9:27 AM  Result Value Ref Range   Sodium 141 135 - 145 mmol/L   Potassium 3.7 3.5 - 5.1 mmol/L   Chloride 103 98 - 111 mmol/L   CO2 30 22 - 32 mmol/L   Glucose, Bld 127 (H) 70 - 99 mg/dL   BUN 15 6 - 20 mg/dL   Creatinine, Ser 7.820.69 0.61 - 1.24 mg/dL   Calcium 9.7 8.9 - 95.610.3 mg/dL   Total Protein 8.2 (H) 6.5 - 8.1 g/dL   Albumin 4.4 3.5 - 5.0 g/dL   AST 24 15 - 41 U/L   ALT 24 0 - 44 U/L   Alkaline Phosphatase 61 38 - 126 U/L   Total Bilirubin 0.8 0.3 - 1.2 mg/dL   GFR calc non  Af Amer >60 >60 mL/min   GFR calc Af Amer >60 >60 mL/min   Anion gap 8 5 - 15  Troponin I (High Sensitivity)     Status: None   Collection Time: 10/25/18  9:27 AM  Result Value Ref Range   Troponin I (High Sensitivity) 2 <18 ng/L  Troponin I (High Sensitivity)     Status: None   Collection Time: 10/25/18 11:43 AM  Result Value Ref Range  Troponin I (High Sensitivity) 3 <18 ng/L   ____________________________________________  EKG My review and personal interpretation at Time: 9:22   Indication: throat tightening  Rate: 60  Rhythm: sinus Axis: normal Other: normal intervlas, no stemi ____________________________________________  RADIOLOGY  I personally reviewed all radiographic images ordered to evaluate for the above acute complaints and reviewed radiology reports and findings.  These findings were personally discussed with the patient.  Please see medical record for radiology report.  ____________________________________________   PROCEDURES  Procedure(s) performed:  Procedures    Critical Care performed: no ____________________________________________   INITIAL IMPRESSION / ASSESSMENT AND PLAN / ED COURSE  Pertinent labs & imaging results that were available during my care of the patient were reviewed by me and considered in my medical decision making (see chart for details).   DDX: ACS, pericarditis, esophagitis, boerhaaves, pe, dissection, pna, bronchitis, costochondritis   Richard Walton is a 58 y.o. who presents to the ED with symptoms as described above.  Patient very anxious appearing on exam.  Afebrile hemodynamically stable without any hypoxia.  Initial EKG shows no evidence of ischemia.  Blood work and CT imaging will be ordered to evaluate for by differential.  Clinical Course as of Oct 25 1243  Thu Oct 25, 2018  1240 Patient reassessed.  Felt significant improvement after Ativan.  Repeat troponin is negative.  Again I do not believe this is consistent with  ACS.  Possible esophageal spasm given his history of reflux but patient is tolerating oral hydration.  There is no evidence of dissection.  Not consistent with PE.  Lower suspicion for bronchospasm infectious process.  No evidence of pneumonia.  His oropharynx is clear and moist.  No evidence of foreign body or erythema.  Could be a large component of anxiety or panic attacks.  At this point do believe he stable and appropriate for outpatient follow-up.   [PR]    Clinical Course User Index [PR] Merlyn Lot, MD    The patient was evaluated in Emergency Department today for the symptoms described in the history of present illness. He/she was evaluated in the context of the global COVID-19 pandemic, which necessitated consideration that the patient might be at risk for infection with the SARS-CoV-2 virus that causes COVID-19. Institutional protocols and algorithms that pertain to the evaluation of patients at risk for COVID-19 are in a state of rapid change based on information released by regulatory bodies including the CDC and federal and state organizations. These policies and algorithms were followed during the patient's care in the ED.  As part of my medical decision making, I reviewed the following data within the [place] notes reviewed and incorporated, Labs reviewed, notes from prior ED visits and [place] Controlled Substance Database   ____________________________________________   FINAL CLINICAL IMPRESSION(S) / ED DIAGNOSES  Final diagnoses:  Throat discomfort      NEW MEDICATIONS STARTED DURING THIS VISIT:  New Prescriptions   LORAZEPAM (ATIVAN) 1 MG TABLET    Take 1 tablet (1 mg total) by mouth every 8 (eight) hours as needed for anxiety.     Note:  This document was prepared using Dragon voice recognition software and may include unintentional dictation errors.    Merlyn Lot, MD 10/25/18 1245

## 2018-10-25 NOTE — ED Triage Notes (Signed)
Pt sent over from [facility] with c/o throat swelling, pt was seen here for the same thing 8/18, states it feels like there is something stuck in his throat. Pt is in NAD, respirations WNL.

## 2018-10-25 NOTE — ED Notes (Signed)
Pt taken to CT at this time.

## 2018-10-25 NOTE — Discharge Instructions (Addendum)
Please follow-up with PCP and GI.  Return for any additional questions or concerns.

## 2018-10-25 NOTE — ED Notes (Signed)
Pt returned from CT at this time. Pt ambulated to the bathroom and back to room, this RN notified by CT tech, this RN hooked patient back up to monitor. PT A&O x4, NAD noted.

## 2018-10-25 NOTE — ED Notes (Signed)
Pt states his wife will be able to come and get him.

## 2018-10-30 DIAGNOSIS — I10 Essential (primary) hypertension: Secondary | ICD-10-CM | POA: Insufficient documentation

## 2018-10-30 DIAGNOSIS — R0602 Shortness of breath: Secondary | ICD-10-CM | POA: Diagnosis not present

## 2018-10-30 DIAGNOSIS — I700 Atherosclerosis of aorta: Secondary | ICD-10-CM | POA: Insufficient documentation

## 2018-10-30 DIAGNOSIS — E785 Hyperlipidemia, unspecified: Secondary | ICD-10-CM | POA: Diagnosis not present

## 2018-10-31 DIAGNOSIS — Z23 Encounter for immunization: Secondary | ICD-10-CM | POA: Diagnosis not present

## 2018-10-31 DIAGNOSIS — K219 Gastro-esophageal reflux disease without esophagitis: Secondary | ICD-10-CM | POA: Diagnosis not present

## 2018-10-31 DIAGNOSIS — R0602 Shortness of breath: Secondary | ICD-10-CM | POA: Diagnosis not present

## 2018-10-31 DIAGNOSIS — R1314 Dysphagia, pharyngoesophageal phase: Secondary | ICD-10-CM | POA: Diagnosis not present

## 2018-10-31 DIAGNOSIS — J301 Allergic rhinitis due to pollen: Secondary | ICD-10-CM | POA: Diagnosis not present

## 2018-11-08 ENCOUNTER — Other Ambulatory Visit: Payer: Self-pay | Admitting: Gastroenterology

## 2018-11-08 DIAGNOSIS — Z86010 Personal history of colonic polyps: Secondary | ICD-10-CM | POA: Diagnosis not present

## 2018-11-08 DIAGNOSIS — E785 Hyperlipidemia, unspecified: Secondary | ICD-10-CM | POA: Diagnosis not present

## 2018-11-08 DIAGNOSIS — R1314 Dysphagia, pharyngoesophageal phase: Secondary | ICD-10-CM | POA: Diagnosis not present

## 2018-11-08 DIAGNOSIS — R0602 Shortness of breath: Secondary | ICD-10-CM | POA: Diagnosis not present

## 2018-11-09 ENCOUNTER — Other Ambulatory Visit
Admission: RE | Admit: 2018-11-09 | Discharge: 2018-11-09 | Disposition: A | Discharge status: Home / Self Care | Payer: 59 | Source: Ambulatory Visit | Attending: Internal Medicine | Admitting: Internal Medicine

## 2018-11-09 ENCOUNTER — Other Ambulatory Visit: Payer: Self-pay

## 2018-11-09 ENCOUNTER — Ambulatory Visit
Admission: RE | Admit: 2018-11-09 | Discharge: 2018-11-09 | Disposition: A | Discharge status: Home / Self Care | Payer: 59 | Source: Ambulatory Visit | Attending: Gastroenterology | Admitting: Gastroenterology

## 2018-11-09 DIAGNOSIS — R1310 Dysphagia, unspecified: Secondary | ICD-10-CM | POA: Diagnosis not present

## 2018-11-09 DIAGNOSIS — R1314 Dysphagia, pharyngoesophageal phase: Secondary | ICD-10-CM | POA: Diagnosis not present

## 2018-11-09 DIAGNOSIS — R0602 Shortness of breath: Secondary | ICD-10-CM | POA: Diagnosis not present

## 2018-11-09 DIAGNOSIS — Z20828 Contact with and (suspected) exposure to other viral communicable diseases: Secondary | ICD-10-CM | POA: Insufficient documentation

## 2018-11-10 LAB — SARS CORONAVIRUS 2 (TAT 6-24 HRS): SARS Coronavirus 2: NEGATIVE

## 2018-11-12 DIAGNOSIS — E785 Hyperlipidemia, unspecified: Secondary | ICD-10-CM | POA: Diagnosis not present

## 2018-11-12 DIAGNOSIS — I700 Atherosclerosis of aorta: Secondary | ICD-10-CM | POA: Diagnosis not present

## 2018-11-12 DIAGNOSIS — I10 Essential (primary) hypertension: Secondary | ICD-10-CM | POA: Diagnosis not present

## 2018-11-12 DIAGNOSIS — R0602 Shortness of breath: Secondary | ICD-10-CM | POA: Diagnosis not present

## 2018-11-15 DIAGNOSIS — R1310 Dysphagia, unspecified: Secondary | ICD-10-CM | POA: Diagnosis not present

## 2018-11-15 DIAGNOSIS — I10 Essential (primary) hypertension: Secondary | ICD-10-CM | POA: Diagnosis not present

## 2018-11-15 DIAGNOSIS — D123 Benign neoplasm of transverse colon: Secondary | ICD-10-CM | POA: Diagnosis not present

## 2018-11-15 DIAGNOSIS — Z86010 Personal history of colonic polyps: Secondary | ICD-10-CM | POA: Diagnosis not present

## 2018-11-15 DIAGNOSIS — K5730 Diverticulosis of large intestine without perforation or abscess without bleeding: Secondary | ICD-10-CM | POA: Diagnosis not present

## 2018-11-15 DIAGNOSIS — K640 First degree hemorrhoids: Secondary | ICD-10-CM | POA: Diagnosis not present

## 2018-11-15 DIAGNOSIS — K635 Polyp of colon: Secondary | ICD-10-CM | POA: Diagnosis not present

## 2018-11-19 DIAGNOSIS — I10 Essential (primary) hypertension: Secondary | ICD-10-CM | POA: Diagnosis not present

## 2018-11-19 DIAGNOSIS — R0602 Shortness of breath: Secondary | ICD-10-CM | POA: Diagnosis not present

## 2018-11-19 DIAGNOSIS — I700 Atherosclerosis of aorta: Secondary | ICD-10-CM | POA: Diagnosis not present

## 2018-11-19 DIAGNOSIS — E785 Hyperlipidemia, unspecified: Secondary | ICD-10-CM | POA: Diagnosis not present

## 2018-11-22 ENCOUNTER — Other Ambulatory Visit: Payer: Self-pay

## 2018-11-22 DIAGNOSIS — K219 Gastro-esophageal reflux disease without esophagitis: Secondary | ICD-10-CM

## 2018-11-22 MED ORDER — PANTOPRAZOLE SODIUM 40 MG PO TBEC: 40.0000 mg | DELAYED_RELEASE_TABLET | Freq: Two times a day (BID) | ORAL | 0 refills | Status: DC

## 2018-11-27 DIAGNOSIS — Z79899 Other long term (current) drug therapy: Secondary | ICD-10-CM | POA: Diagnosis not present

## 2018-11-28 ENCOUNTER — Ambulatory Visit: Payer: Self-pay | Admitting: Internal Medicine

## 2018-11-28 ENCOUNTER — Other Ambulatory Visit: Payer: Self-pay

## 2018-11-28 VITALS — BP 150/94 | HR 76 | Temp 97.5°F

## 2018-11-28 DIAGNOSIS — Z860100 Personal history of colon polyps, unspecified: Secondary | ICD-10-CM

## 2018-11-28 DIAGNOSIS — K219 Gastro-esophageal reflux disease without esophagitis: Secondary | ICD-10-CM

## 2018-11-28 DIAGNOSIS — R070 Pain in throat: Secondary | ICD-10-CM

## 2018-11-28 DIAGNOSIS — I10 Essential (primary) hypertension: Secondary | ICD-10-CM

## 2018-11-28 DIAGNOSIS — E7849 Other hyperlipidemia: Secondary | ICD-10-CM

## 2018-11-28 NOTE — Progress Notes (Signed)
S -Patient waiting several minutes after visit scheduled to start in lobby and is first visit in afternoon and I went myself and roomed.   Patient is a 58 year old male who was last seen in this clinic August 24 by a colleague and diagnosed with worsening reflux.  His Protonix was increased to 40 mg twice a day with the timing altered some for benefit and also strict reflux precautions reviewed.  He then went to see an internist at [facility] clinic on 8/27, and was evaluated then in the emergency room for pressure in upper throat,feeling of not moving air,like there is a blockage,worse with exertion.  The work-up ruled out an acute cardiac event, with follow-up consultations scheduled.  He was seen by cardiology October 30, 2018 on consultation, with recommendations to increase his simvastatin to 40 mg daily and a stress echo study was ordered and patient noted was all good. He was not having any degree of chest pain noted at that time.  He was seen by gastroenterology November 08, 2018 with a barium swallow ordered and completed on September 11 and that was negative.  He was to continue the meds. A colonoscopy and upper endoscopy was also ordered, and he noted he had a polyp on the colonoscopy and the upper was good. He returned to taking the protonix to once daily and has been good with no GERD sx's.  He saw ENT as well and had a scope up his nose and no concerning findings per patient.He noted the flovent and azelastine nasal spray were added by ENT and he takes twice daily.  He presents today as a one-month follow-up that was scheduled after his visit here on August 24.  He notes his original sx's of feeling like phlegm in his throat can occur from time to time still, but he no longer panics and it goes away. Has not been problematic in the recent past. He denies any recent CP's, palp's, SOB and no LE swelling. No abdominal pains.  He is active and working out and not limited at all at  present.  He brought his meds with him to review Included:  Norvasc - 10mg  daily Simvastatin - 40mg  daily Protonix - 40mg  daily Flonase nasal spray daily Albuterol inh prn Flovent - bid Azelastine nasal spray - 0.1% bid  No Known Allergies   Former smoker   O - NAD, masked  VS - 144/88 manually by myself on right, recheck from initial of 150/94 by machine, P - 76, T - 97.5  HEENT - sclera anicteric Car - RRR without m/g/r Pulm - CTA Abd - soft, NT to palpation Ext - no LE edema Affect was not flat, approp with conversation  Ass/Plan 1. Throat discomfort, like phlegm getting stuck in throat at times, difficulty then catching his breath noted, seen in ER and work-up to date included the above procedures and no findings of marked concern noted to date as possible source. Noted doing well as not panic when occurs and anxiety likely played a role as well noted. Seen here in late August with GERD concern and that is better and back on once daily protonix  Cont once daily protonix and GERD precautions Cont on statin for increased lipids Has ativan to use prn and not using in recent past and agreed with not using this at present Not feel further referrals nor procedures needed at present and he was very much in agreement with this at present as we closely monitor his clinical status  Staying active important and he plans to Noted his BP was borderline high today on recheck and need to closely monitor presently.  Will cont the other above meds presently F/u as rec'ed with the specialists he has seen to date and F/u here again in 4-6 weeks at the latest, sooner prn I did inform him today that his f/u with not be with me as I will not be available at this practice to see patients after this week.

## 2018-11-29 ENCOUNTER — Telehealth: Payer: Self-pay

## 2018-11-29 NOTE — Telephone Encounter (Signed)
You left a note asking if patient had scheduled his colonoscopy as discussed at his 08/13/2018 physical with Dr. Roxan Hockey.  Richard Walton states had upper & lower on 11/15/2018.  AMD

## 2018-12-26 ENCOUNTER — Encounter: Payer: Self-pay | Admitting: Registered Nurse

## 2018-12-26 ENCOUNTER — Ambulatory Visit: Payer: Self-pay | Admitting: Registered Nurse

## 2018-12-26 ENCOUNTER — Other Ambulatory Visit: Payer: Self-pay

## 2018-12-26 VITALS — BP 130/70 | HR 76 | Temp 98.9°F | Resp 12 | Ht 70.0 in | Wt 191.0 lb

## 2018-12-26 DIAGNOSIS — I10 Essential (primary) hypertension: Secondary | ICD-10-CM

## 2018-12-26 DIAGNOSIS — K219 Gastro-esophageal reflux disease without esophagitis: Secondary | ICD-10-CM

## 2018-12-26 MED ORDER — PANTOPRAZOLE SODIUM 40 MG PO TBEC: 40.0000 mg | DELAYED_RELEASE_TABLET | Freq: Every day | ORAL | 5 refills | Status: DC

## 2018-12-26 NOTE — Patient Instructions (Signed)
Stress Stress is a normal reaction to life events. Stress is what you feel when life demands more than you are used to, or more than you think you can handle. Some stress can be useful, such as studying for a test or meeting a deadline at work. Stress that occurs too often or for too long can cause problems. It can affect your emotional health and interfere with relationships and normal daily activities. Too much stress can weaken your body's defense system (immune system) and increase your risk for physical illness. If you already have a medical problem, stress can make it worse. What are the causes? All sorts of life events can cause stress. An event that causes stress for one person may not be stressful for another person. Major life events, whether positive or negative, commonly cause stress. Examples include:  Losing a job or starting a new job.  Losing a loved one.  Moving to a new town or home.  Getting married or divorced.  Having a baby.  Injury or illness. Less obvious life events can also cause stress, especially if they occur day after day or in combination with each other. Examples include:  Working long hours.  Driving in traffic.  Caring for children.  Being in debt.  Being in a difficult relationship. What are the signs or symptoms? Stress can cause emotional symptoms, including:  Anxiety. This is feeling worried, afraid, on edge, overwhelmed, or out of control.  Anger, including irritation or impatience.  Depression. This is feeling sad, down, helpless, or guilty.  Trouble focusing, remembering, or making decisions. Stress can cause physical symptoms, including:  Aches and pains. These may affect your head, neck, back, stomach, or other areas of your body.  Tight muscles or a clenched jaw.  Low energy.  Trouble sleeping. Stress can cause unhealthy behaviors, including:  Eating to feel better (overeating) or skipping meals.  Working too much or  putting off tasks.  Smoking, drinking alcohol, or using drugs to feel better. How is this diagnosed? Stress is diagnosed through an assessment by your health care provider. He or she may diagnose this condition based on:  Your symptoms and any stressful life events.  Your medical history.  Tests to rule out other causes of your symptoms. Depending on your condition, your health care provider may refer you to a specialist for further evaluation. How is this treated?  Stress management techniques are the recommended treatment for stress. Medicine is not typically recommended for the treatment of stress. Techniques to reduce your reaction to stressful life events include:  Stress identification. Monitor yourself for symptoms of stress and identify what causes stress for you. These skills may help you to avoid or prepare for stressful events.  Time management. Set your priorities, keep a calendar of events, and learn to say "no." Taking these actions can help you avoid making too many commitments. Techniques for coping with stress include:  Rethinking the problem. Try to think realistically about stressful events rather than ignoring them or overreacting. Try to find the positives in a stressful situation rather than focusing on the negatives.  Exercise. Physical exercise can release both physical and emotional tension. The key is to find a form of exercise that you enjoy and do it regularly.  Relaxation techniques. These relax the body and mind. The key is to find one or more that you enjoy and use the technique(s) regularly. Examples include: ? Meditation, deep breathing, or progressive relaxation techniques. ? Yoga or tai chi. ?  Biofeedback, mindfulness techniques, or journaling. ? Listening to music, being out in nature, or participating in other hobbies.  Practicing a healthy lifestyle. Eat a balanced diet, drink plenty of water, limit or avoid caffeine, and get plenty of sleep.   Having a strong support network. Spend time with family, friends, or other people you enjoy being around. Express your feelings and talk things over with someone you trust. Counseling or talk therapy with a mental health professional may be helpful if you are having trouble managing stress on your own. Follow these instructions at home: Lifestyle   Avoid drugs.  Do not use any products that contain nicotine or tobacco, such as cigarettes and e-cigarettes. If you need help quitting, ask your health care provider.  Limit alcohol intake to no more than 1 drink a day for nonpregnant women and 2 drinks a day for men. One drink equals 12 oz of beer, 5 oz of wine, or 1 oz of hard liquor.  Do not use alcohol or drugs to relax.  Eat a balanced diet that includes fresh fruits and vegetables, whole grains, lean meats, fish, eggs, and beans, and low-fat dairy. Avoid processed foods and foods high in added fat, sugar, and salt.  Exercise at least 30 minutes on 5 or more days each week.  Get 7-8 hours of sleep each night. General instructions   Practice stress management techniques as discussed with your health care provider.  Drink enough fluid to keep your urine clear or pale yellow.  Take over-the-counter and prescription medicines only as told by your health care provider.  Keep all follow-up visits as told by your health care provider. This is important. Contact a health care provider if:  Your symptoms get worse.  You have new symptoms.  You feel overwhelmed by your problems and can no longer manage them on your own. Get help right away if:  You have thoughts of hurting yourself or others. If you ever feel like you may hurt yourself or others, or have thoughts about taking your own life, get help right away. You can go to your nearest emergency department or call:  Your local emergency services (911 in the [place]  A suicide crisis helpline, such as the [place] at 559-083-6211. This is open 24 hours a day. Summary  Stress is a normal reaction to life events. It can cause problems if it happens too often or for too long.  Practicing stress management techniques is the best way to treat stress.  Counseling or talk therapy with a mental health professional may be helpful if you are having trouble managing stress on your own. This information is not intended to replace advice given to you by your health care provider. Make sure you discuss any questions you have with your health care provider. Document Released: 08/10/2000 Document Revised: 01/27/2017 Document Reviewed: 04/06/2016 Elsevier Patient Education  2020 [employer]. Managing Your Hypertension Hypertension is commonly called high blood pressure. This is when the force of your blood pressing against the walls of your arteries is too strong. Arteries are blood vessels that carry blood from your heart throughout your body. Hypertension forces the heart to work harder to pump blood, and may cause the arteries to become narrow or stiff. Having untreated or uncontrolled hypertension can cause heart attack, stroke, kidney disease, and other problems. What are blood pressure readings? A blood pressure reading consists of a higher number over a lower number. Ideally, your blood pressure should be  below 120/80. The first ("top") number is called the systolic pressure. It is a measure of the pressure in your arteries as your heart beats. The second ("bottom") number is called the diastolic pressure. It is a measure of the pressure in your arteries as the heart relaxes. What does my blood pressure reading mean? Blood pressure is classified into four stages. Based on your blood pressure reading, your health care provider may use the following stages to determine what type of treatment you need, if any. Systolic pressure and diastolic pressure are measured in a unit called mm Hg. Normal  Systolic  pressure: below 120.  Diastolic pressure: below 80. Elevated  Systolic pressure: 161-096.  Diastolic pressure: below 80. Hypertension stage 1  Systolic pressure: 045-409.  Diastolic pressure: 81-19. Hypertension stage 2  Systolic pressure: 147 or above.  Diastolic pressure: 90 or above. What health risks are associated with hypertension? Managing your hypertension is an important responsibility. Uncontrolled hypertension can lead to:  A heart attack.  A stroke.  A weakened blood vessel (aneurysm).  Heart failure.  Kidney damage.  Eye damage.  Metabolic syndrome.  Memory and concentration problems. What changes can I make to manage my hypertension? Hypertension can be managed by making lifestyle changes and possibly by taking medicines. Your health care provider will help you make a plan to bring your blood pressure within a normal range. Eating and drinking   Eat a diet that is high in fiber and potassium, and low in salt (sodium), added sugar, and fat. An example eating plan is called the DASH (Dietary Approaches to Stop Hypertension) diet. To eat this way: ? Eat plenty of fresh fruits and vegetables. Try to fill half of your plate at each meal with fruits and vegetables. ? Eat whole grains, such as whole wheat pasta, brown rice, or whole grain bread. Fill about one quarter of your plate with whole grains. ? Eat low-fat diary products. ? Avoid fatty cuts of meat, processed or cured meats, and poultry with skin. Fill about one quarter of your plate with lean proteins such as fish, chicken without skin, beans, eggs, and tofu. ? Avoid premade and processed foods. These tend to be higher in sodium, added sugar, and fat.  Reduce your daily sodium intake. Most people with hypertension should eat less than 1,500 mg of sodium a day.  Limit alcohol intake to no more than 1 drink a day for nonpregnant women and 2 drinks a day for men. One drink equals 12 oz of beer, 5 oz of  wine, or 1 oz of hard liquor. Lifestyle  Work with your health care provider to maintain a healthy body weight, or to lose weight. Ask what an ideal weight is for you.  Get at least 30 minutes of exercise that causes your heart to beat faster (aerobic exercise) most days of the week. Activities may include walking, swimming, or biking.  Include exercise to strengthen your muscles (resistance exercise), such as weight lifting, as part of your weekly exercise routine. Try to do these types of exercises for 30 minutes at least 3 days a week.  Do not use any products that contain nicotine or tobacco, such as cigarettes and e-cigarettes. If you need help quitting, ask your health care provider.  Control any long-term (chronic) conditions you have, such as high cholesterol or diabetes. Monitoring  Monitor your blood pressure at home as told by your health care provider. Your personal target blood pressure may vary depending on your medical  conditions, your age, and other factors.  Have your blood pressure checked regularly, as often as told by your health care provider. Working with your health care provider  Review all the medicines you take with your health care provider because there may be side effects or interactions.  Talk with your health care provider about your diet, exercise habits, and other lifestyle factors that may be contributing to hypertension.  Visit your health care provider regularly. Your health care provider can help you create and adjust your plan for managing hypertension. Will I need medicine to control my blood pressure? Your health care provider may prescribe medicine if lifestyle changes are not enough to get your blood pressure under control, and if:  Your systolic blood pressure is 130 or higher.  Your diastolic blood pressure is 80 or higher. Take medicines only as told by your health care provider. Follow the directions carefully. Blood pressure medicines must  be taken as prescribed. The medicine does not work as well when you skip doses. Skipping doses also puts you at risk for problems. Contact a health care provider if:  You think you are having a reaction to medicines you have taken.  You have repeated (recurrent) headaches.  You feel dizzy.  You have swelling in your ankles.  You have trouble with your vision. Get help right away if:  You develop a severe headache or confusion.  You have unusual weakness or numbness, or you feel faint.  You have severe pain in your chest or abdomen.  You vomit repeatedly.  You have trouble breathing. Summary  Hypertension is when the force of blood pumping through your arteries is too strong. If this condition is not controlled, it may put you at risk for serious complications.  Your personal target blood pressure may vary depending on your medical conditions, your age, and other factors. For most people, a normal blood pressure is less than 120/80.  Hypertension is managed by lifestyle changes, medicines, or both. Lifestyle changes include weight loss, eating a healthy, low-sodium diet, exercising more, and limiting alcohol. This information is not intended to replace advice given to you by your health care provider. Make sure you discuss any questions you have with your health care provider. Document Released: 11/09/2011 Document Revised: 06/08/2018 Document Reviewed: 01/13/2016 Elsevier Patient Education  [place]. Gastroesophageal Reflux Disease, Adult Gastroesophageal reflux (GER) happens when acid from the stomach flows up into the tube that connects the mouth and the stomach (esophagus). Normally, food travels down the esophagus and stays in the stomach to be digested. However, when a person has GER, food and stomach acid sometimes move back up into the esophagus. If this becomes a more serious problem, the person may be diagnosed with a disease called gastroesophageal reflux disease  (GERD). GERD occurs when the reflux:  Happens often.  Causes frequent or severe symptoms.  Causes problems such as damage to the esophagus. When stomach acid comes in contact with the esophagus, the acid may cause soreness (inflammation) in the esophagus. Over time, GERD may create small holes (ulcers) in the lining of the esophagus. What are the causes? This condition is caused by a problem with the muscle between the esophagus and the stomach (lower esophageal sphincter, or LES). Normally, the LES muscle closes after food passes through the esophagus to the stomach. When the LES is weakened or abnormal, it does not close properly, and that allows food and stomach acid to go back up into the esophagus. The LES  can be weakened by certain dietary substances, medicines, and medical conditions, including:  Tobacco use.  Pregnancy.  Having a hiatal hernia.  Alcohol use.  Certain foods and beverages, such as coffee, chocolate, onions, and peppermint. What increases the risk? You are more likely to develop this condition if you:  Have an increased body weight.  Have a connective tissue disorder.  Use NSAID medicines. What are the signs or symptoms? Symptoms of this condition include:  Heartburn.  Difficult or painful swallowing.  The feeling of having a lump in the throat.  Abitter taste in the mouth.  Bad breath.  Having a large amount of saliva.  Having an upset or bloated stomach.  Belching.  Chest pain. Different conditions can cause chest pain. Make sure you see your health care provider if you experience chest pain.  Shortness of breath or wheezing.  Ongoing (chronic) cough or a night-time cough.  Wearing away of tooth enamel.  Weight loss. How is this diagnosed? Your health care provider will take a medical history and perform a physical exam. To determine if you have mild or severe GERD, your health care provider may also monitor how you respond to  treatment. You may also have tests, including:  A test to examine your stomach and esophagus with a small camera (endoscopy).  A test thatmeasures the acidity level in your esophagus.  A test thatmeasures how much pressure is on your esophagus.  A barium swallow or modified barium swallow test to show the shape, size, and functioning of your esophagus. How is this treated? The goal of treatment is to help relieve your symptoms and to prevent complications. Treatment for this condition may vary depending on how severe your symptoms are. Your health care provider may recommend:  Changes to your diet.  Medicine.  Surgery. Follow these instructions at home: Eating and drinking   Follow a diet as recommended by your health care provider. This may involve avoiding foods and drinks such as: ? Coffee and tea (with or without caffeine). ? Drinks that containalcohol. ? Energy drinks and sports drinks. ? Carbonated drinks or sodas. ? Chocolate and cocoa. ? Peppermint and mint flavorings. ? Garlic and onions. ? Horseradish. ? Spicy and acidic foods, including peppers, chili powder, curry powder, vinegar, hot sauces, and barbecue sauce. ? Citrus fruit juices and citrus fruits, such as oranges, lemons, and limes. ? Tomato-based foods, such as red sauce, chili, salsa, and pizza with red sauce. ? Fried and fatty foods, such as donuts, french fries, potato chips, and high-fat dressings. ? High-fat meats, such as hot dogs and fatty cuts of red and white meats, such as rib eye steak, sausage, ham, and bacon. ? High-fat dairy items, such as whole milk, butter, and cream cheese.  Eat small, frequent meals instead of large meals.  Avoid drinking large amounts of liquid with your meals.  Avoid eating meals during the 2-3 hours before bedtime.  Avoid lying down right after you eat.  Do not exercise right after you eat. Lifestyle   Do not use any products that contain nicotine or tobacco,  such as cigarettes, e-cigarettes, and chewing tobacco. If you need help quitting, ask your health care provider.  Try to reduce your stress by using methods such as yoga or meditation. If you need help reducing stress, ask your health care provider.  If you are overweight, reduce your weight to an amount that is healthy for you. Ask your health care provider for guidance about a safe  weight loss goal. General instructions  Pay attention to any changes in your symptoms.  Take over-the-counter and prescription medicines only as told by your health care provider. Do not take aspirin, ibuprofen, or other NSAIDs unless your health care provider told you to do so.  Wear loose-fitting clothing. Do not wear anything tight around your waist that causes pressure on your abdomen.  Raise (elevate) the head of your bed about 6 inches (15 cm).  Avoid bending over if this makes your symptoms worse.  Keep all follow-up visits as told by your health care provider. This is important. Contact a health care provider if:  You have: ? New symptoms. ? Unexplained weight loss. ? Difficulty swallowing or it hurts to swallow. ? Wheezing or a persistent cough. ? A hoarse voice.  Your symptoms do not improve with treatment. Get help right away if you:  Have pain in your arms, neck, jaw, teeth, or back.  Feel sweaty, dizzy, or light-headed.  Have chest pain or shortness of breath.  Vomit and your vomit looks like blood or coffee grounds.  Faint.  Have stool that is bloody or black.  Cannot swallow, drink, or eat. Summary  Gastroesophageal reflux happens when acid from the stomach flows up into the esophagus. GERD is a disease in which the reflux happens often, causes frequent or severe symptoms, or causes problems such as damage to the esophagus.  Treatment for this condition may vary depending on how severe your symptoms are. Your health care provider may recommend diet and lifestyle changes,  medicine, or surgery.  Contact a health care provider if you have new or worsening symptoms.  Take over-the-counter and prescription medicines only as told by your health care provider. Do not take aspirin, ibuprofen, or other NSAIDs unless your health care provider told you to do so.  Keep all follow-up visits as told by your health care provider. This is important. This information is not intended to replace advice given to you by your health care provider. Make sure you discuss any questions you have with your health care provider. Document Released: 11/24/2004 Document Revised: 08/23/2017 Document Reviewed: 08/23/2017 Elsevier Patient Education  2020 [place] Risks of Being Overweight Maintaining a healthy body weight is an important part of your overall health. Your healthy body weight depends on your age, gender, and height. Being overweight puts you at risk for many health problems, including:  Heart disease.  Diabetes.  Problems sleeping.  Joint problems. You can make changes to your diet and lifestyle to prevent these risks. Consider working with a health care provider or a dietitian to make these changes. What nutrition changes can be made?   Eat only as much as your body needs. In most cases, this is about 2,000 calories a day, but the amount varies depending on your height, gender, and activity level. Ask your health care provider how many calories you should have each day. Eating more than your body needs on a regular basis can cause you to become overweight or obese.  Eat slowly, and stop eating when you feel full.  Choose healthy foods, including: ? Fruits and vegetables. ? Lean meats. ? Low-fat dairy products. ? High-fiber foods, such as whole grains and beans. ? Healthy snacks like vegetable sticks, a piece of fruit, or a small amount of yogurt or cheese.  Avoid foods and drinks that are high in sugar, salt (sodium), saturated fat, or trans  fat. This includes: ? Many desserts such as  candy, cookies, and ice cream. ? Soda. ? Fried foods. ? Processed meats such as hot dogs or lunch meats. ? Prepackaged snack foods. What lifestyle changes can be made?   Exercise for at least 150 minutes a week to prevent weight gain, or as often as recommended by your health care provider. Do moderate-intensity exercise, such as brisk walking. ? Spread it out by exercising for 30 minutes 5 days a week, or in short 10-minute bursts several times a day.  Find other ways to stay active and burn calories, such as yard work or a hobby that involves physical activity.  Get at least 8 hours of sleep each night. When you are well-rested, you are more likely to be active and make healthy choices during the day. To sleep better: ? Try to go to bed and wake up at about the same time every day. ? Keep your bedroom dark, quiet, and cool. ? Make sure that your bed is comfortable. ? Avoid stimulating activities, such as watching television or exercising, for at least one hour before bedtime. Why are these changes important? Eating healthy and being active helps you lose weight and prevent health problems caused by being overweight. Making these changes can also help you manage stress, feel better mentally, and connect with friends and family. What can happen if changes are not made? Being overweight can affect you for your entire life. You may develop joint or bone problems that make it painful or difficult for you to play sports or do activities you enjoy. Being overweight puts stress on your heart and lungs and can lead to medical problems like diabetes, heart disease, and sleeping problems. Where to find support You can get support for preventing health risks of being overweight from:  Your health care provider or a dietitian. They can provide guidance about healthy eating and healthy lifestyle choices.  Weight loss support groups, online or in-person.  Where to find more information  MyPlate: FormerBoss.no ? This an online tool that provides personalized recommendations about foods to eat each day.  The Centers for Disease Control and Prevention: http://sharp-hammond.biz/ ? This resource gives tips for managing weight and having an active lifestyle. Summary  To prevent unhealthy weight gain, it is important to maintain a healthy diet high in vegetables and whole grains, exercise regularly, and get at least 8 hours of sleep each night.  Making these changes helps prevent many long-term (chronic) health conditions that can shorten your life, such as diabetes, heart disease, and stroke. This information is not intended to replace advice given to you by your health care provider. Make sure you discuss any questions you have with your health care provider. Document Released: 01/11/2017 Document Revised: 02/17/2017 Document Reviewed: 01/11/2017 Elsevier Patient Education  2020 [employer].

## 2018-12-26 NOTE — Progress Notes (Signed)
Last visit with Dr. Roxan Hockey was 11/28/2018.  Barium Swallow, Upper & lower GI. Echocardiogram & Stress Test  States he feels fine.  No breathing issues.  Thinks maybe he was just pushing himself too much & experienced some anxiety.  States he taking Protonix daily.  AMD

## 2018-12-26 NOTE — Progress Notes (Signed)
Subjective:    Patient ID: Richard Walton, male    DOB: Jan 04, 1961, 58 y.o.   MRN: [id number]  57y/o married caucasian male here for BP and dysphasia follow up.  Patient reported his symptoms resolved feeling much better and was able to decrease protonix from BID to daily dosing again without symptoms worsening or dysphagia reoccurring.  Gained back 20lbs after CSP/EGD/cardiology stress tests returned essentially normal.  Denied any health concerns today except that he will run out of protonix next month and insurance company needs prior authorization for him to get refill.  Denied heartburn/ n/v/d/black or red blood in stools.  Having stool daily without straining.  Denied sob/dyspnea/chest pain/headaches/visual changes/or swelling hands/feet.     Review of Systems  Constitutional: Negative for activity change, appetite change, chills, diaphoresis, fatigue and fever.  HENT: Negative for congestion, trouble swallowing and voice change.   Eyes: Negative for photophobia, pain, discharge, redness, itching and visual disturbance.  Respiratory: Negative for cough, choking, chest tightness, shortness of breath, wheezing and stridor.   Cardiovascular: Negative for chest pain, palpitations and leg swelling.  Gastrointestinal: Negative for abdominal distention, abdominal pain, anal bleeding, blood in stool, constipation, diarrhea, nausea and vomiting.  Endocrine: Negative for cold intolerance and heat intolerance.  Genitourinary: Negative for difficulty urinating.  Musculoskeletal: Negative for arthralgias, back pain, gait problem, joint swelling, myalgias, neck pain and neck stiffness.  Skin: Negative for color change, pallor, rash and wound.  Allergic/Immunologic: Positive for environmental allergies. Negative for food allergies.  Neurological: Negative for dizziness, tremors, seizures, syncope, facial asymmetry, speech difficulty, weakness, light-headedness, numbness and headaches.  Hematological:  Negative for adenopathy. Does not bruise/bleed easily.  Psychiatric/Behavioral: Negative for agitation, confusion and sleep disturbance.       Objective:   Physical Exam Vitals signs and nursing note reviewed.  Constitutional:      General: He is awake. He is not in acute distress.    Appearance: Normal appearance. He is well-developed, well-groomed and overweight. He is not ill-appearing, toxic-appearing or diaphoretic.  HENT:     Head: Normocephalic and atraumatic.     Jaw: There is normal jaw occlusion.     Salivary Glands: Right salivary gland is not diffusely enlarged or tender. Left salivary gland is not diffusely enlarged or tender.     Right Ear: Hearing, tympanic membrane, ear canal and external ear normal. There is no impacted cerumen.     Left Ear: Hearing, tympanic membrane, ear canal and external ear normal. There is no impacted cerumen.     Nose: Nose normal. No congestion or rhinorrhea.     Right Sinus: No maxillary sinus tenderness or frontal sinus tenderness.     Left Sinus: No maxillary sinus tenderness or frontal sinus tenderness.     Mouth/Throat:     Lips: Pink. No lesions.     Mouth: Mucous membranes are moist. No injury, lacerations, oral lesions or angioedema.     Dentition: Normal dentition. No dental tenderness, gingival swelling, dental abscesses or gum lesions.     Tongue: No lesions. Tongue does not deviate from midline.     Palate: No mass and lesions.     Pharynx: Oropharynx is clear. Uvula midline. No pharyngeal swelling, oropharyngeal exudate, posterior oropharyngeal erythema or uvula swelling.     Tonsils: No tonsillar exudate. 0 on the right. 0 on the left.  Eyes:     General: Lids are normal. Vision grossly intact. Gaze aligned appropriately. No allergic shiner, visual field deficit or scleral  icterus.       Right eye: No discharge.        Left eye: No discharge.     Extraocular Movements: Extraocular movements intact.     Conjunctiva/sclera:  Conjunctivae normal.     Pupils: Pupils are equal, round, and reactive to light.  Neck:     Musculoskeletal: Normal range of motion and neck supple. Normal range of motion. No edema, erythema, neck rigidity, crepitus, injury, pain with movement, torticollis, spinous process tenderness or muscular tenderness.     Thyroid: No thyroid mass, thyromegaly or thyroid tenderness.     Trachea: Trachea and phonation normal.  Cardiovascular:     Rate and Rhythm: Normal rate and regular rhythm.     Pulses: Normal pulses.          Radial pulses are 2+ on the right side and 2+ on the left side.     Heart sounds: Normal heart sounds. No murmur. No friction rub. No gallop.   Pulmonary:     Effort: Pulmonary effort is normal. No respiratory distress.     Breath sounds: Normal breath sounds and air entry. No stridor, decreased air movement or transmitted upper airway sounds. No decreased breath sounds, wheezing, rhonchi or rales.     Comments: No cough observed in exam room; spoke full sentences without difficulty; wearing cloth mask due to covid 19 pandemic Abdominal:     General: Abdomen is flat. Bowel sounds are normal. There is no distension or abdominal bruit. There are no signs of injury.     Palpations: Abdomen is soft. There is no shifting dullness, fluid wave, hepatomegaly, splenomegaly, mass or pulsatile mass.     Tenderness: There is no abdominal tenderness. There is no right CVA tenderness, left CVA tenderness, guarding or rebound. Negative signs include Murphy's sign.     Hernia: No hernia is present. There is no hernia in the umbilical area or ventral area.     Comments: Dull to percussion x 4 quads; normoactive bowel sounds x 4 quads  Musculoskeletal: Normal range of motion.        General: No deformity or signs of injury.     Right shoulder: Normal.     Left shoulder: Normal.     Right elbow: Normal.    Left elbow: Normal.     Right hip: Normal.     Left hip: Normal.     Right knee:  Normal.     Left knee: Normal.     Right ankle: Normal.     Left ankle: Normal.     Cervical back: Normal.     Thoracic back: Normal.     Lumbar back: Normal.     Right hand: Normal.     Left hand: Normal.     Right lower leg: No edema.     Left lower leg: No edema.  Lymphadenopathy:     Head:     Right side of head: No submental, submandibular, tonsillar, preauricular, posterior auricular or occipital adenopathy.     Left side of head: No submental, submandibular, tonsillar, preauricular, posterior auricular or occipital adenopathy.     Cervical: No cervical adenopathy.     Right cervical: No superficial cervical adenopathy.    Left cervical: No superficial cervical adenopathy.  Skin:    General: Skin is warm and dry.     Capillary Refill: Capillary refill takes less than 2 seconds.     Coloration: Skin is not ashen, cyanotic, jaundiced, mottled, pale or sallow.  Findings: No abrasion, acne, bruising, burn, erythema, lesion, petechiae or rash.     Nails: There is no clubbing.   Neurological:     General: No focal deficit present.     Mental Status: He is alert and oriented to person, place, and time. Mental status is at baseline.     GCS: GCS eye subscore is 4. GCS verbal subscore is 5. GCS motor subscore is 6.     Cranial Nerves: Cranial nerves are intact. No cranial nerve deficit, dysarthria or facial asymmetry.     Sensory: Sensation is intact. No sensory deficit.     Motor: Motor function is intact. No weakness, tremor, atrophy, abnormal muscle tone or seizure activity.     Coordination: Coordination is intact. Coordination normal.     Gait: Gait is intact. Gait normal.     Comments: Gait sure and steady in clinic; on/off exam table and in/out of chair without difficulty; bilateral hand grasp equal 5/5  Psychiatric:        Attention and Perception: Attention and perception normal.        Mood and Affect: Mood and affect normal.        Speech: Speech normal.         Behavior: Behavior normal. Behavior is cooperative.        Thought Content: Thought content normal.        Cognition and Memory: Cognition and memory normal.        Judgment: Judgment normal.      Reviewed cardiology/Dr Dorris Fetch office visit/labs/barium swallow in Epic.  Unable to review CSP/EGD procedure reports computer states not available at this time.     Assessment & Plan:  A-essential hypertension, GERD, BMI 27  P-blood pressure good today 130/70.  continue amlodipine 10mg  po daily.  I recommend optometry visit at least every 2 years due to hypertension and if visual changes prn typically insurance will cover at least every other year.  Avoid further weight gain, exercise 150 minutes per week.  Dietary fiber 30 grams per day and keep added sugars under 150 calories/9 teaspoons per day.  Consider dash/mediterranean diet.  BMI 27 consider weight loss to BMI less than 25. Follow up 1 year or sooner if headaches/chest pain/dyspnea/sob/hand/feet swelling and or visual changes.   Patient stated he feels better at this time weight than 170lbs.  Exitcare handout on managing hypertension, stress and preventing health risks of being overweight.  Patient verbalized understanding information/instructions, agreed with plan of care and had no further questions at this time.  Awaiting prior authorization from insurance/pharmacy.  New Rx entered for patient protonix 40mg  po daily #30 RF6 electronically to his pharmacy of choice.  Follow up if trouble swallowing/food sticking reoccurs or heartburn no longer controlled with protonix 40mg  po daily.  Avoid trigger foods. Exitcare handout on GERD. Patient verbalized understanding information/instructions, agreed with plan of care and had no further questions at this time.

## 2019-03-13 ENCOUNTER — Other Ambulatory Visit: Payer: Self-pay

## 2019-03-13 DIAGNOSIS — I10 Essential (primary) hypertension: Secondary | ICD-10-CM

## 2019-03-13 MED ORDER — AMLODIPINE BESYLATE 10 MG PO TABS: 10.0000 mg | ORAL_TABLET | Freq: Every day | ORAL | 2 refills | Status: DC

## 2019-03-13 NOTE — Telephone Encounter (Signed)
Last seen by me 12/26/2018 office visit BP 130/70 HR 76 BMI 27 renal and liver function normal  Electronic Rx with refills amlodipine 10mg  po daily #90 RF2.  Labs due Oct 2021 annual and office visit.

## 2019-03-20 ENCOUNTER — Ambulatory Visit: Payer: Managed Care, Other (non HMO) | Attending: Internal Medicine

## 2019-03-20 DIAGNOSIS — Z20822 Contact with and (suspected) exposure to covid-19: Secondary | ICD-10-CM | POA: Diagnosis not present

## 2019-03-22 LAB — NOVEL CORONAVIRUS, NAA: SARS-CoV-2, NAA: DETECTED — AB

## 2019-03-23 ENCOUNTER — Telehealth: Payer: Self-pay | Admitting: Internal Medicine

## 2019-03-23 NOTE — Telephone Encounter (Signed)
.  bam

## 2019-03-23 NOTE — Telephone Encounter (Signed)
Last telephone incomplete.   I connected by phone with Richard Walton on 03/23/2019 at 4:10 PM to discuss the potential use of an new treatment for mild to moderate COVID-19 viral infection in non-hospitalized patients.  This patient is a 59 y.o. male that appears to meets the FDA criteria for Emergency Use Authorization of bamlanivimab or casirivimab\imdevimab.  Has a (+) direct SARS-CoV-2 viral test result  Has mild or moderate COVID-19   Is ? 59 years of age and weighs ? 40 kg  Is NOT hospitalized due to COVID-19  Specific high risk criteria : >55yo c hx htn and possibly CAD  Left message with pt to call infusion clinic if interested in learning more about monoclonal antibody treatment. Will need to first determine onset of symptoms and any O2 requirements to determine if pt a candidate should he be interested.   Cyndee Brightly, NP-C [facility]

## 2019-05-03 ENCOUNTER — Other Ambulatory Visit: Payer: Self-pay

## 2019-05-03 DIAGNOSIS — E785 Hyperlipidemia, unspecified: Secondary | ICD-10-CM

## 2019-05-03 MED ORDER — SIMVASTATIN 40 MG PO TABS: 40.0000 mg | ORAL_TABLET | Freq: Every day | ORAL | 0 refills | Status: DC

## 2019-06-18 ENCOUNTER — Other Ambulatory Visit: Payer: Self-pay

## 2019-06-18 DIAGNOSIS — E785 Hyperlipidemia, unspecified: Secondary | ICD-10-CM

## 2019-06-18 MED ORDER — SIMVASTATIN 40 MG PO TABS: 40.0000 mg | ORAL_TABLET | Freq: Every day | ORAL | 0 refills | Status: DC

## 2019-07-17 ENCOUNTER — Ambulatory Visit: Payer: Self-pay

## 2019-07-17 ENCOUNTER — Other Ambulatory Visit: Payer: Self-pay

## 2019-07-17 DIAGNOSIS — Z0000 Encounter for general adult medical examination without abnormal findings: Secondary | ICD-10-CM

## 2019-07-17 LAB — POCT URINALYSIS DIPSTICK
Bilirubin, UA: NEGATIVE
Blood, UA: NEGATIVE
Glucose, UA: NEGATIVE
Ketones, UA: NEGATIVE
Leukocytes, UA: NEGATIVE
Nitrite, UA: NEGATIVE
Protein, UA: NEGATIVE
Spec Grav, UA: 1.025 (ref 1.010–1.025)
Urobilinogen, UA: 0.2 E.U./dL
pH, UA: 6 (ref 5.0–8.0)

## 2019-07-17 NOTE — Progress Notes (Signed)
Scheduled to complete physical - TBD.  AMD

## 2019-07-18 LAB — CMP12+LP+TP+TSH+6AC+PSA+CBC…
ALT: 35 IU/L (ref 0–44)
AST: 37 IU/L (ref 0–40)
Albumin/Globulin Ratio: 1.8 (ref 1.2–2.2)
Albumin: 4.5 g/dL (ref 3.8–4.9)
Alkaline Phosphatase: 64 IU/L (ref 48–121)
BUN/Creatinine Ratio: 12 (ref 9–20)
BUN: 12 mg/dL (ref 6–24)
Basophils Absolute: 0.1 10*3/uL (ref 0.0–0.2)
Basos: 1 %
Bilirubin Total: 0.4 mg/dL (ref 0.0–1.2)
Calcium: 9.2 mg/dL (ref 8.7–10.2)
Chloride: 97 mmol/L (ref 96–106)
Chol/HDL Ratio: 3.3 ratio (ref 0.0–5.0)
Cholesterol, Total: 166 mg/dL (ref 100–199)
Creatinine, Ser: 0.97 mg/dL (ref 0.76–1.27)
EOS (ABSOLUTE): 0 10*3/uL (ref 0.0–0.4)
Eos: 0 %
Estimated CHD Risk: <0.5 times avg. (ref 0.0–1.0)
Free Thyroxine Index: 1.8 (ref 1.2–4.9)
GFR calc Af Amer: 98 mL/min/{1.73_m2} (ref >59)
GFR calc non Af Amer: 85 mL/min/{1.73_m2} (ref >59)
GGT: 23 IU/L (ref 0–65)
Globulin, Total: 2.5 g/dL (ref 1.5–4.5)
Glucose: 102 mg/dL — ABNORMAL HIGH (ref 65–99)
HDL: 51 mg/dL (ref >39)
Hematocrit: 43.8 % (ref 37.5–51.0)
Hemoglobin: 14.8 g/dL (ref 13.0–17.7)
Immature Grans (Abs): 0 10*3/uL (ref 0.0–0.1)
Immature Granulocytes: 0 %
Iron: 155 ug/dL (ref 38–169)
LDH: 223 IU/L (ref 121–224)
LDL Chol Calc (NIH): 99 mg/dL (ref 0–99)
Lymphocytes Absolute: 2.1 10*3/uL (ref 0.7–3.1)
Lymphs: 25 %
MCH: 32.8 pg (ref 26.6–33.0)
MCHC: 33.8 g/dL (ref 31.5–35.7)
MCV: 97 fL (ref 79–97)
Monocytes Absolute: 0.8 10*3/uL (ref 0.1–0.9)
Monocytes: 10 %
Neutrophils Absolute: 5.3 10*3/uL (ref 1.4–7.0)
Neutrophils: 64 %
Phosphorus: 2.2 mg/dL — ABNORMAL LOW (ref 2.8–4.1)
Platelets: 288 10*3/uL (ref 150–450)
Potassium: 4 mmol/L (ref 3.5–5.2)
Prostate Specific Ag, Serum: 0.6 ng/mL (ref 0.0–4.0)
RBC: 4.51 x10E6/uL (ref 4.14–5.80)
RDW: 13.1 % (ref 11.6–15.4)
Sodium: 134 mmol/L (ref 134–144)
T3 Uptake Ratio: 27 % (ref 24–39)
T4, Total: 6.5 ug/dL (ref 4.5–12.0)
TSH: 1.18 u[IU]/mL (ref 0.450–4.500)
Total Protein: 7 g/dL (ref 6.0–8.5)
Triglycerides: 88 mg/dL (ref 0–149)
Uric Acid: 8.1 mg/dL (ref 3.8–8.4)
VLDL Cholesterol Cal: 16 mg/dL (ref 5–40)
WBC: 8.4 10*3/uL (ref 3.4–10.8)

## 2019-07-30 ENCOUNTER — Other Ambulatory Visit: Payer: Self-pay

## 2019-07-30 ENCOUNTER — Encounter: Payer: Self-pay | Admitting: Emergency Medicine

## 2019-07-30 ENCOUNTER — Ambulatory Visit: Payer: Self-pay | Admitting: Emergency Medicine

## 2019-07-30 VITALS — BP 144/90 | HR 71 | Temp 97.8°F | Resp 12 | Ht 70.0 in | Wt 198.0 lb

## 2019-07-30 DIAGNOSIS — E785 Hyperlipidemia, unspecified: Secondary | ICD-10-CM

## 2019-07-30 DIAGNOSIS — Z0000 Encounter for general adult medical examination without abnormal findings: Secondary | ICD-10-CM

## 2019-07-30 MED ORDER — SIMVASTATIN 40 MG PO TABS: 40.0000 mg | ORAL_TABLET | Freq: Every day | ORAL | 3 refills | Status: DC

## 2019-07-30 NOTE — Progress Notes (Signed)
I have reviewed the triage vital signs and the nursing notes.   HISTORY  Chief Complaint Annual Exam   HPI Richard Walton is a 59 y.o. male is here for his annual physical.  He denies any new problems.        Past Medical History:  Diagnosis Date  . Colon polyp   . Coronary artery disease   . GERD (gastroesophageal reflux disease)   . Hyperlipidemia   . Hypertension   . Seasonal allergies     Patient Active Problem List   Diagnosis Date Noted  . Throat discomfort 11/28/2018  . History of colonic polyps 11/28/2018  . Hypertension, essential 10/30/2018  . Aortic atherosclerosis ([place]) 10/30/2018  . Shortness of breath 10/30/2018  . Gastroesophageal reflux disease without esophagitis 10/22/2018  . HLD (hyperlipidemia) 10/22/2018    Past Surgical History:  Procedure Laterality Date  . ANKLE SURGERY    . CLAVICLE SURGERY    . COLONOSCOPY    . RHINOPLASTY    . ROTATOR CUFF REPAIR Bilateral     Prior to Admission medications   Medication Sig Start Date End Date Taking? Authorizing Provider  amLODipine (NORVASC) 10 MG tablet Take 1 tablet (10 mg total) by mouth daily. 03/13/19  Yes Betancourt, Aura Fey, NP  aspirin EC 81 MG tablet Take 81 mg by mouth daily.   Yes [provider]  fluticasone (FLONASE) 50 MCG/ACT nasal spray Place into the nose.   Yes [provider]  Multiple Vitamin (MULTIVITAMIN) capsule Take 1 capsule by mouth daily.   Yes [provider]  Omega-3 Fatty Acids (FISH OIL ODOR-LESS) 1200 MG CAPS Take by mouth.   Yes [provider]  simvastatin (ZOCOR) 40 MG tablet Take 1 tablet (40 mg total) by mouth at bedtime. 06/18/19  Yes Johnn Hai, PA-C  albuterol (VENTOLIN HFA) 108 (90 Base) MCG/ACT inhaler Inhale 2 puffs into the lungs every 6 (six) hours as needed for wheezing or shortness of breath. 10/16/18   Merlyn Lot, MD  Calcium Carbonate Antacid (TUMS PO) Take by mouth.    [provider]  [facility]  HEARTBURN RELIEF 160-105 MG CHEW CHEW 4 PILLS PRIOR TO WORKING OUT IN GYM 10/31/18   [provider]  LORazepam (ATIVAN) 1 MG tablet Take 1 tablet (1 mg total) by mouth every 8 (eight) hours as needed for anxiety. Patient not taking: Reported on 12/26/2018 10/25/18 10/25/19  Merlyn Lot, MD  pantoprazole (PROTONIX) 40 MG tablet Take 1 tablet (40 mg total) by mouth daily. 12/26/18 06/24/19  Betancourt, Aura Fey, NP    Allergies Patient has no known allergies.  Family History  Problem Relation Age of Onset  . Heart disease Mother   . Kidney disease Father   . Heart disease Brother     Social History Social History   Tobacco Use  . Smoking status: Former Smoker    Types: Cigarettes  . Smokeless tobacco: Never Used  Substance Use Topics  . Alcohol use: Yes  . Drug use: Yes    Types: Marijuana    Review of Systems Constitutional: No fever/chills Eyes: No visual changes. ENT: No sore throat. Cardiovascular: Denies chest pain. Respiratory: Denies shortness of breath. Gastrointestinal: No abdominal pain.  No nausea, no vomiting.   Genitourinary: Negative for dysuria.  Musculoskeletal: Negative for back or joint pain. Skin: Negative for rash. Neurological: Negative for headaches, focal weakness or numbness. ____________________________________________   PHYSICAL EXAM: Constitutional: Alert and oriented. Well appearing and in no acute distress.  Eyes: Conjunctivae are normal. PERRL. EOMI. Head: Atraumatic. Nose: No congestion/rhinnorhea. Mouth/Throat: Mucous membranes are moist.  Oropharynx non-erythematous. Neck: No stridor.  No cervical tenderness on palpation posteriorly.   Cardiovascular: Normal rate, regular rhythm. Grossly normal heart sounds.  Good peripheral circulation. Respiratory: Normal respiratory effort.  No retractions. Lungs CTAB. Gastrointestinal: Soft and nontender. No distention.  Bowel sounds normoactive x4 quadrants. Musculoskeletal: Nontender  thoracic or lumbar spine.  Moves upper and lower extremities without any difficulty.  No lower extremity tenderness nor edema.  No joint effusions.  Normal gait was noted. Neurologic:  Normal speech and language. No gross focal neurologic deficits are appreciated. No gait instability. Skin:  Skin is warm, dry and intact. No rash noted. Psychiatric: Mood and affect are normal. Speech and behavior are normal.  ____________________________________________   LABS (all labs ordered are listed, but only abnormal results are displayed)  Labs were discussed with patient. ____________________________________________  EKG Sinus rhythm with a ventricular rate of 63, PR interval 154, QT 408  ____________________________________________   FINAL CLINICAL IMPRESSION(S) Normal male physical.  Will refill simvastatin.  Patient currently is exercising daily and is encouraged to continue to do so.    ED Discharge Orders         Ordered    EKG 12-Lead     07/30/19 0948           Note:  This document was prepared using Dragon voice recognition software and may include unintentional dictation errors.

## 2019-09-16 ENCOUNTER — Other Ambulatory Visit: Payer: Self-pay | Admitting: Registered Nurse

## 2019-09-16 DIAGNOSIS — K219 Gastro-esophageal reflux disease without esophagitis: Secondary | ICD-10-CM

## 2019-09-16 NOTE — Telephone Encounter (Signed)
Assume COB pt? Thanks.

## 2019-12-08 ENCOUNTER — Other Ambulatory Visit: Payer: Self-pay | Admitting: Registered Nurse

## 2019-12-08 DIAGNOSIS — I10 Essential (primary) hypertension: Secondary | ICD-10-CM

## 2019-12-09 ENCOUNTER — Other Ambulatory Visit: Payer: Self-pay

## 2019-12-09 DIAGNOSIS — J302 Other seasonal allergic rhinitis: Secondary | ICD-10-CM

## 2019-12-09 MED ORDER — FLUTICASONE PROPIONATE 50 MCG/ACT NA SUSP: 1.0000 | Freq: Every day | NASAL | 3 refills | Status: DC

## 2019-12-09 NOTE — Telephone Encounter (Signed)
COB pt. Thanks!

## 2020-02-03 DIAGNOSIS — S61412A Laceration without foreign body of left hand, initial encounter: Secondary | ICD-10-CM | POA: Diagnosis not present

## 2020-07-15 NOTE — Progress Notes (Signed)
Scheduled to complete physical 07/22/20 with Adam Scarboro, NP-C.  AMD 

## 2020-07-16 ENCOUNTER — Other Ambulatory Visit: Payer: Self-pay

## 2020-07-16 ENCOUNTER — Ambulatory Visit: Payer: Self-pay

## 2020-07-16 DIAGNOSIS — Z0000 Encounter for general adult medical examination without abnormal findings: Secondary | ICD-10-CM

## 2020-07-16 LAB — POCT URINALYSIS DIPSTICK
Bilirubin, UA: NEGATIVE
Blood, UA: NEGATIVE
Glucose, UA: NEGATIVE
Ketones, UA: NEGATIVE
Leukocytes, UA: NEGATIVE
Nitrite, UA: NEGATIVE
Protein, UA: NEGATIVE
Spec Grav, UA: 1.025 (ref 1.010–1.025)
Urobilinogen, UA: 0.2 E.U./dL
pH, UA: 5 (ref 5.0–8.0)

## 2020-07-17 LAB — CMP12+LP+TP+TSH+6AC+PSA+CBC…
ALT: 50 IU/L — ABNORMAL HIGH (ref 0–44)
AST: 38 IU/L (ref 0–40)
Albumin/Globulin Ratio: 1.8 (ref 1.2–2.2)
Albumin: 4.6 g/dL (ref 3.8–4.9)
Alkaline Phosphatase: 78 IU/L (ref 44–121)
BUN/Creatinine Ratio: 14 (ref 10–24)
BUN: 15 mg/dL (ref 8–27)
Basophils Absolute: 0.1 10*3/uL (ref 0.0–0.2)
Basos: 1 %
Bilirubin Total: 0.5 mg/dL (ref 0.0–1.2)
Calcium: 9.3 mg/dL (ref 8.6–10.2)
Chloride: 97 mmol/L (ref 96–106)
Chol/HDL Ratio: 4 ratio (ref 0.0–5.0)
Cholesterol, Total: 178 mg/dL (ref 100–199)
Creatinine, Ser: 1.04 mg/dL (ref 0.76–1.27)
EOS (ABSOLUTE): 0.2 10*3/uL (ref 0.0–0.4)
Eos: 2 %
Estimated CHD Risk: 0.8 times avg. (ref 0.0–1.0)
Free Thyroxine Index: 1.7 (ref 1.2–4.9)
GGT: 30 IU/L (ref 0–65)
Globulin, Total: 2.6 g/dL (ref 1.5–4.5)
Glucose: 102 mg/dL — ABNORMAL HIGH (ref 65–99)
HDL: 44 mg/dL (ref >39)
Hematocrit: 42.3 % (ref 37.5–51.0)
Hemoglobin: 14.8 g/dL (ref 13.0–17.7)
Immature Grans (Abs): 0 10*3/uL (ref 0.0–0.1)
Immature Granulocytes: 0 %
Iron: 197 ug/dL — ABNORMAL HIGH (ref 38–169)
LDH: 225 IU/L — ABNORMAL HIGH (ref 121–224)
LDL Chol Calc (NIH): 109 mg/dL — ABNORMAL HIGH (ref 0–99)
Lymphocytes Absolute: 2.6 10*3/uL (ref 0.7–3.1)
Lymphs: 37 %
MCH: 32.7 pg (ref 26.6–33.0)
MCHC: 35 g/dL (ref 31.5–35.7)
MCV: 94 fL (ref 79–97)
Monocytes Absolute: 0.8 10*3/uL (ref 0.1–0.9)
Monocytes: 11 %
Neutrophils Absolute: 3.4 10*3/uL (ref 1.4–7.0)
Neutrophils: 49 %
Phosphorus: 2.4 mg/dL — ABNORMAL LOW (ref 2.8–4.1)
Platelets: 301 10*3/uL (ref 150–450)
Potassium: 4.4 mmol/L (ref 3.5–5.2)
Prostate Specific Ag, Serum: 0.6 ng/mL (ref 0.0–4.0)
RBC: 4.52 x10E6/uL (ref 4.14–5.80)
RDW: 13.1 % (ref 11.6–15.4)
Sodium: 134 mmol/L (ref 134–144)
T3 Uptake Ratio: 26 % (ref 24–39)
T4, Total: 6.6 ug/dL (ref 4.5–12.0)
TSH: 2.6 u[IU]/mL (ref 0.450–4.500)
Total Protein: 7.2 g/dL (ref 6.0–8.5)
Triglycerides: 140 mg/dL (ref 0–149)
Uric Acid: 7 mg/dL (ref 3.8–8.4)
VLDL Cholesterol Cal: 25 mg/dL (ref 5–40)
WBC: 7 10*3/uL (ref 3.4–10.8)
eGFR: 82 mL/min/{1.73_m2} (ref >59)

## 2020-07-22 ENCOUNTER — Ambulatory Visit: Payer: Self-pay | Admitting: Adult Health

## 2020-07-22 ENCOUNTER — Encounter: Payer: Self-pay | Admitting: Adult Health

## 2020-07-22 ENCOUNTER — Other Ambulatory Visit: Payer: Self-pay

## 2020-07-22 VITALS — BP 150/90 | HR 63 | Temp 98.0°F | Resp 14 | Ht 70.0 in | Wt 214.0 lb

## 2020-07-22 DIAGNOSIS — I10 Essential (primary) hypertension: Secondary | ICD-10-CM

## 2020-07-22 DIAGNOSIS — Z0000 Encounter for general adult medical examination without abnormal findings: Secondary | ICD-10-CM

## 2020-07-22 DIAGNOSIS — K219 Gastro-esophageal reflux disease without esophagitis: Secondary | ICD-10-CM

## 2020-07-22 DIAGNOSIS — E785 Hyperlipidemia, unspecified: Secondary | ICD-10-CM

## 2020-07-22 NOTE — Progress Notes (Signed)
Pt presents today to complete physical with Blima Ledger, NP.   Gretel Acre

## 2020-07-22 NOTE — Progress Notes (Signed)
[facility] [place], [place] [address]  Internal MEDICINE  Office Visit Note  Patient Name: Richard Walton  [id number]  [id number]  Date of Service: 07/22/2020  Chief Complaint  Patient presents with  . Annual Exam    Pt denies any concerns or issues for today,     HPI Pt is here for routine health maintenance examination.  Mr. Trouten is retired from [employer].  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  He has been married to his current wife for 21 years.  He takes care of her, she is currently battling MS. He has two grown children and 4 grandchildren. His medical history is remarkable for HTN, GERD, and High cholesterol. See med list below.    Current Medication: Outpatient Encounter Medications as of 07/22/2020  Medication Sig Note  . amLODipine (NORVASC) 10 MG tablet TAKE 1 TABLET BY MOUTH EVERY DAY   . aspirin EC 81 MG tablet Take 81 mg by mouth daily.   . fluticasone (FLONASE) 50 MCG/ACT nasal spray Place 1 spray into both nostrils daily.   . Multiple Vitamin (MULTIVITAMIN) capsule Take 1 capsule by mouth daily.   . Omega-3 Fatty Acids (FISH OIL ODOR-LESS) 1200 MG CAPS Take by mouth.   . pantoprazole (PROTONIX) 40 MG tablet TAKE 1 TABLET BY MOUTH EVERY DAY**WAITING ON PA**   . simvastatin (ZOCOR) 40 MG tablet Take 1 tablet (40 mg total) by mouth at bedtime.   . [DISCONTINUED] Calcium Carbonate Antacid (TUMS PO) Take by mouth. 07/30/2019: Takes prn   No facility-administered encounter medications on file as of 07/22/2020.    Surgical History: Past Surgical History:  Procedure Laterality Date  . ANKLE SURGERY    . CLAVICLE SURGERY    . COLONOSCOPY    . RHINOPLASTY    . ROTATOR CUFF REPAIR Bilateral     Medical History: Past Medical History:  Diagnosis Date  . Colon polyp   . Coronary artery disease   . GERD (gastroesophageal reflux disease)   . Hyperlipidemia   . Hypertension   . Seasonal  allergies     Family History: Family History  Problem Relation Age of Onset  . Heart disease Mother   . Kidney disease Father   . Heart disease Brother     Social History: Social History   Socioeconomic History  . Marital status: Married    Spouse name: Not on file  . Number of children: Not on file  . Years of education: Not on file  . Highest education level: Not on file  Occupational History  . Not on file  Tobacco Use  . Smoking status: Former Smoker    Types: Cigarettes  . Smokeless tobacco: Never Used  Vaping Use  . Vaping Use: Never used  Substance and Sexual Activity  . Alcohol use: Yes  . Drug use: Yes    Types: Marijuana  . Sexual activity: Not on file  Other Topics Concern  . Not on file  Social History Narrative  . Not on file   Social Determinants of Health   Financial Resource Strain: Not on file  Food Insecurity: Not on file  Transportation Needs: Not on file  Physical Activity: Not on file  Stress: Not on file  Social Connections: Not on file      Review of Systems  Constitutional: Negative for activity change, appetite change and fatigue.  HENT: Negative for congestion, sinus pain,  trouble swallowing and voice change.   Eyes: Negative for pain, discharge and visual disturbance.  Respiratory: Negative for cough, chest tightness and shortness of breath.   Cardiovascular: Negative for chest pain and leg swelling.  Gastrointestinal: Negative for abdominal distention, abdominal pain, constipation and diarrhea.  Endocrine: Negative for cold intolerance and heat intolerance.  Genitourinary: Negative for difficulty urinating, hematuria, penile pain and penile swelling.  Musculoskeletal: Negative for arthralgias, back pain and neck pain.  Skin: Negative for color change.  Neurological: Negative for dizziness, weakness and headaches.  Hematological: Negative for adenopathy.  Psychiatric/Behavioral: Negative for agitation, confusion and suicidal  ideas.     Vital Signs: BP (!) 150/90   Pulse 63   Temp 98 F (36.7 C)   Resp 14   Ht $R'5\' 10"'tg$  (1.778 m)   Wt 214 lb (97.1 kg)   SpO2 95%   BMI 30.71 kg/m    Physical Exam Constitutional:      Appearance: Normal appearance.  HENT:     Head: Normocephalic.     Right Ear: Tympanic membrane normal.     Left Ear: Tympanic membrane normal.     Nose: Nose normal.     Mouth/Throat:     Mouth: Mucous membranes are moist.     Pharynx: No oropharyngeal exudate or posterior oropharyngeal erythema.  Eyes:     General:        Right eye: No discharge.        Left eye: No discharge.     Extraocular Movements: Extraocular movements intact.     Pupils: Pupils are equal, round, and reactive to light.  Cardiovascular:     Rate and Rhythm: Normal rate and regular rhythm.     Pulses: Normal pulses.     Heart sounds: Normal heart sounds. No murmur heard.   Pulmonary:     Effort: Pulmonary effort is normal. No respiratory distress.     Breath sounds: Normal breath sounds. No wheezing or rhonchi.  Abdominal:     General: Abdomen is flat. Bowel sounds are normal. There is no distension.     Palpations: There is no mass.     Tenderness: There is no abdominal tenderness. There is no guarding.     Hernia: No hernia is present.  Musculoskeletal:        General: No swelling or deformity. Normal range of motion.     Cervical back: Normal range of motion.  Skin:    General: Skin is warm and dry.     Capillary Refill: Capillary refill takes less than 2 seconds.  Neurological:     General: No focal deficit present.     Mental Status: He is alert.     Cranial Nerves: No cranial nerve deficit.     Gait: Gait normal.  Psychiatric:        Mood and Affect: Mood normal.        Behavior: Behavior normal.        Judgment: Judgment normal.      LABS: Recent Results (from the past 2160 hour(s))  CMP12+LP+TP+TSH+6AC+PSA+CBC.     Status: Abnormal   Collection Time: 07/16/20  9:26 AM  Result  Value Ref Range   Glucose 102 (H) 65 - 99 mg/dL   Uric Acid 7.0 3.8 - 8.4 mg/dL    Comment:            Therapeutic target for gout patients: <6.0   BUN 15 8 - 27 mg/dL   Creatinine, Ser 1.04 0.76 -  1.27 mg/dL   eGFR 82 >59 mL/min/1.73   BUN/Creatinine Ratio 14 10 - 24   Sodium 134 134 - 144 mmol/L   Potassium 4.4 3.5 - 5.2 mmol/L   Chloride 97 96 - 106 mmol/L   Calcium 9.3 8.6 - 10.2 mg/dL   Phosphorus 2.4 (L) 2.8 - 4.1 mg/dL   Total Protein 7.2 6.0 - 8.5 g/dL   Albumin 4.6 3.8 - 4.9 g/dL   Globulin, Total 2.6 1.5 - 4.5 g/dL   Albumin/Globulin Ratio 1.8 1.2 - 2.2   Bilirubin Total 0.5 0.0 - 1.2 mg/dL   Alkaline Phosphatase 78 44 - 121 IU/L   LDH 225 (H) 121 - 224 IU/L   AST 38 0 - 40 IU/L   ALT 50 (H) 0 - 44 IU/L   GGT 30 0 - 65 IU/L   Iron 197 (H) 38 - 169 ug/dL   Cholesterol, Total 178 100 - 199 mg/dL   Triglycerides 140 0 - 149 mg/dL   HDL 44 >39 mg/dL   VLDL Cholesterol Cal 25 5 - 40 mg/dL   LDL Chol Calc (NIH) 109 (H) 0 - 99 mg/dL   Chol/HDL Ratio 4.0 0.0 - 5.0 ratio    Comment:                                   T. Chol/HDL Ratio                                             Men  Women                               1/2 Avg.Risk  3.4    3.3                                   Avg.Risk  5.0    4.4                                2X Avg.Risk  9.6    7.1                                3X Avg.Risk 23.4   11.0    Estimated CHD Risk 0.8 0.0 - 1.0 times avg.    Comment: The CHD Risk is based on the T. Chol/HDL ratio. Other factors affect CHD Risk such as hypertension, smoking, diabetes, severe obesity, and family history of premature CHD.    TSH 2.600 0.450 - 4.500 uIU/mL   T4, Total 6.6 4.5 - 12.0 ug/dL   T3 Uptake Ratio 26 24 - 39 %   Free Thyroxine Index 1.7 1.2 - 4.9   Prostate Specific Ag, Serum 0.6 0.0 - 4.0 ng/mL    Comment: Roche ECLIA methodology. According to the American Urological Association, Serum PSA should decrease and remain at undetectable levels after  radical prostatectomy. The AUA defines biochemical recurrence as an initial PSA value 0.2 ng/mL or greater followed by a subsequent confirmatory PSA value 0.2 ng/mL or greater. Values obtained with different assay methods or kits  cannot be used interchangeably. Results cannot be interpreted as absolute evidence of the presence or absence of malignant disease.    WBC 7.0 3.4 - 10.8 x10E3/uL   RBC 4.52 4.14 - 5.80 x10E6/uL   Hemoglobin 14.8 13.0 - 17.7 g/dL   Hematocrit 42.3 37.5 - 51.0 %   MCV 94 79 - 97 fL   MCH 32.7 26.6 - 33.0 pg   MCHC 35.0 31.5 - 35.7 g/dL   RDW 13.1 11.6 - 15.4 %   Platelets 301 150 - 450 x10E3/uL   Neutrophils 49 Not Estab. %   Lymphs 37 Not Estab. %   Monocytes 11 Not Estab. %   Eos 2 Not Estab. %   Basos 1 Not Estab. %   Neutrophils Absolute 3.4 1.4 - 7.0 x10E3/uL   Lymphocytes Absolute 2.6 0.7 - 3.1 x10E3/uL   Monocytes Absolute 0.8 0.1 - 0.9 x10E3/uL   EOS (ABSOLUTE) 0.2 0.0 - 0.4 x10E3/uL   Basophils Absolute 0.1 0.0 - 0.2 x10E3/uL   Immature Granulocytes 0 Not Estab. %   Immature Grans (Abs) 0.0 0.0 - 0.1 x10E3/uL  POCT urinalysis dipstick     Status: Normal   Collection Time: 07/16/20  9:48 AM  Result Value Ref Range   Color, UA yellow    Clarity, UA clear    Glucose, UA Negative Negative   Bilirubin, UA negative    Ketones, UA negative    Spec Grav, UA 1.025 1.010 - 1.025   Blood, UA negative    pH, UA 5.0 5.0 - 8.0   Protein, UA Negative Negative   Urobilinogen, UA 0.2 0.2 or 1.0 E.U./dL   Nitrite, UA negative    Leukocytes, UA Negative Negative   Appearance medium    Odor      Assessment/Plan: 1. Annual physical exam Up to date on PHM.   2. Essential hypertension Diastolic above 90 today, patient denies chest pain, sob, dizziness, numbness or tingling. Will take BP at home over the next 2 weeks and follow up to review log.  Continue amlodipine as prescribed.   3. Hyperlipidemia, unspecified hyperlipidemia type Continue  Simvastatin as prescribed.   4. Gastroesophageal reflux disease without esophagitis Continue Protonix as prescribed.   5. Iron excess Discussed stopping Iron supplementation now that he is not giving blood regularly since his level is elevated. He is agreeable.       General Counseling: Zyon verbalizes understanding of the findings of todays visit and agrees with plan of treatment. I have discussed any further diagnostic evaluation that may be needed or ordered today. We also reviewed his medications today. he has been encouraged to call the office with any questions or concerns that should arise related to todays visit.    No orders of the defined types were placed in this encounter.   No orders of the defined types were placed in this encounter.   Total time spent: 40 Minutes  Time spent includes review of chart, medications, test results, and follow up plan with the patient.    Kendell Bane AGNP-C Nurse Practitioner

## 2020-08-03 ENCOUNTER — Other Ambulatory Visit: Payer: Self-pay

## 2020-08-03 ENCOUNTER — Ambulatory Visit: Payer: Self-pay | Admitting: Adult Health

## 2020-08-03 ENCOUNTER — Encounter: Payer: Self-pay | Admitting: Adult Health

## 2020-08-03 VITALS — BP 121/84 | HR 60 | Temp 99.1°F | Resp 12 | Ht 70.0 in | Wt 214.0 lb

## 2020-08-03 DIAGNOSIS — I10 Essential (primary) hypertension: Secondary | ICD-10-CM

## 2020-08-03 NOTE — Progress Notes (Signed)
[facility] [address] [place], [place] [address]   Office Visit Note  Patient Name: Richard Walton  [id number]  [id number]  Date of Service: 08/03/2020  Chief Complaint  Patient presents with  . Hypertension     HPI Pt is here for recheck of BP.  At last visit patient has midley elevated BP.  Since then he has continued to take his BP meds and monitor his pressure.  His log today shows Bps around 130-142/80's.  He has 3-4 pressures with diastolic over 90, these were all, as he  Was taking his BP medication that morning.  He denies any dizziness, headache, chest pain or other symptoms.       Current Medication:  Outpatient Encounter Medications as of 08/03/2020  Medication Sig  . amLODipine (NORVASC) 10 MG tablet TAKE 1 TABLET BY MOUTH EVERY DAY  . aspirin EC 81 MG tablet Take 81 mg by mouth daily.  . fluticasone (FLONASE) 50 MCG/ACT nasal spray Place 1 spray into both nostrils daily.  . Multiple Vitamin (MULTIVITAMIN) capsule Take 1 capsule by mouth daily.  . Omega-3 Fatty Acids (FISH OIL ODOR-LESS) 1200 MG CAPS Take by mouth.  . pantoprazole (PROTONIX) 40 MG tablet TAKE 1 TABLET BY MOUTH EVERY DAY**WAITING ON PA**  . simvastatin (ZOCOR) 40 MG tablet Take 1 tablet (40 mg total) by mouth at bedtime.   No facility-administered encounter medications on file as of 08/03/2020.      Medical History: Past Medical History:  Diagnosis Date  . Colon polyp   . Coronary artery disease   . GERD (gastroesophageal reflux disease)   . Hyperlipidemia   . Hypertension   . Seasonal allergies      Vital Signs: BP 121/84 (BP Location: Left Arm, Patient Position: Sitting, Cuff Size: Large)   Pulse 60   Temp 99.1 F (37.3 C) (Temporal)   Resp 12   Ht 5\' 10"  (1.778 m)   Wt 214 lb (97.1 kg)   SpO2 95%   BMI 30.71 kg/m    Review of Systems  Constitutional: Negative for diaphoresis and fatigue.  Cardiovascular: Negative for chest pain, palpitations and leg swelling.     Physical Exam Constitutional:      Appearance: Normal appearance.  Cardiovascular:     Rate and Rhythm: Normal rate and regular rhythm.     Pulses: Normal pulses.     Heart sounds: Normal heart sounds.  Pulmonary:     Effort: Pulmonary effort is normal.     Breath sounds: Normal breath sounds.  Neurological:     Mental Status: He is alert.       Assessment/Plan: 1. Hypertension, essential Continue Norvasc as prescribed.  Continue to monitor BP every 2-3 days and call clinic if diastolic pressures return/stay above 90 mmHg. IF Chest pain, dizziness or headache develop, take BP and follow up as discussed.  Hypertension Counseling:   The following hypertensive lifestyle modification were recommended and discussed:  1. Limiting alcohol intake to less than 1 oz/day of ethanol:(24 oz of beer or 8 oz of wine or 2 oz of 100-proof whiskey). 2. Take baby ASA 81 mg daily. 3. Importance of regular aerobic exercise and losing weight. 4. Reduce dietary saturated fat and cholesterol intake for overall cardiovascular health. 5. Maintaining adequate dietary potassium, calcium, and magnesium intake. 6. Regular monitoring of the blood pressure. 7. Reduce sodium intake to less than 100 mmol/day (less than 2.3 gm of sodium or less than 6 gm of sodium  choride)     General Counseling: Nollie verbalizes understanding of the findings of todays visit and agrees with plan of treatment. I have discussed any further diagnostic evaluation that may be needed or ordered today. We also reviewed his medications today. he has been encouraged to call the office with any questions or concerns that should arise related to todays visit.   No orders of the defined types were placed in this encounter.   No orders of the defined types were placed in this encounter.   Time spent: 20 Minutes    Johnna Acosta AGNP-C Nurse Practitioner

## 2020-08-03 NOTE — Progress Notes (Signed)
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  No record of it in the paper chart in the clinic. Pneumonia Vaccine - Interested but doesn't want today.  Put a note to postpone until 01/2021. Tdap - Last one was 12/15/2011 - not due until next year.  Updated immunizations in Epic to reflect the date from Tdap consent form in the paper chart in the clinic.  Reviewed above information with Blima Ledger, NP-C.  AMD

## 2020-08-17 ENCOUNTER — Other Ambulatory Visit: Payer: Self-pay

## 2020-08-17 DIAGNOSIS — E785 Hyperlipidemia, unspecified: Secondary | ICD-10-CM

## 2020-08-18 MED ORDER — SIMVASTATIN 40 MG PO TABS: 40.0000 mg | ORAL_TABLET | Freq: Every day | ORAL | 3 refills | Status: DC

## 2020-08-19 ENCOUNTER — Other Ambulatory Visit: Payer: Self-pay | Admitting: Physician Assistant

## 2020-08-19 DIAGNOSIS — I10 Essential (primary) hypertension: Secondary | ICD-10-CM

## 2020-08-19 DIAGNOSIS — K219 Gastro-esophageal reflux disease without esophagitis: Secondary | ICD-10-CM

## 2020-09-04 ENCOUNTER — Other Ambulatory Visit: Payer: Self-pay

## 2020-09-04 NOTE — Telephone Encounter (Signed)
Rx refill requests already in Epic for Norvasc & Protonix.  They were sent electronically by Patient's pharmacy.  AMD

## 2020-09-16 IMAGING — CT CT ANGIOGRAPHY CHEST
3 of 7 series · 19 of 46 positions shown · IV contrast (omnipaque)
Comparison: 10/16/2018

CLINICAL DATA: Chest pain, tightness, shortness of breath

EXAM:
CT ANGIOGRAPHY CHEST WITH CONTRAST
TECHNIQUE: Multidetector CT imaging of the chest was performed using the
standard protocol during bolus administration of intravenous
contrast. Multiplanar CT image reconstructions and MIPs were
obtained to evaluate the vascular anatomy.
CONTRAST:  75mL OMNIPAQUE IOHEXOL 350 MG/ML SOLN

[Series 5: axial arterial · axial · arterial · 0.70mm/px · z∈[-40,+233]mm · 9 of 115 slices shown]
[im 12/115  lung]
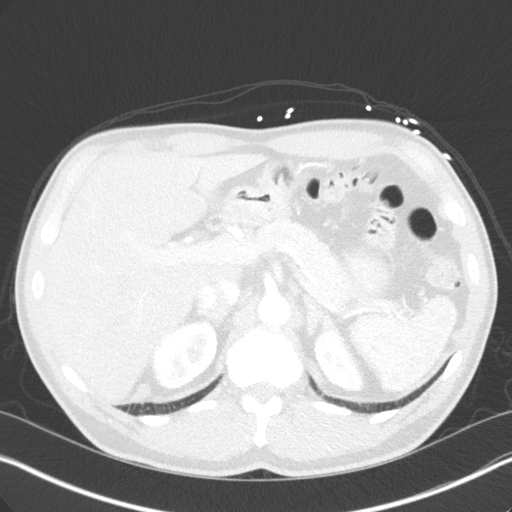
[im 23/115  soft-tissue]
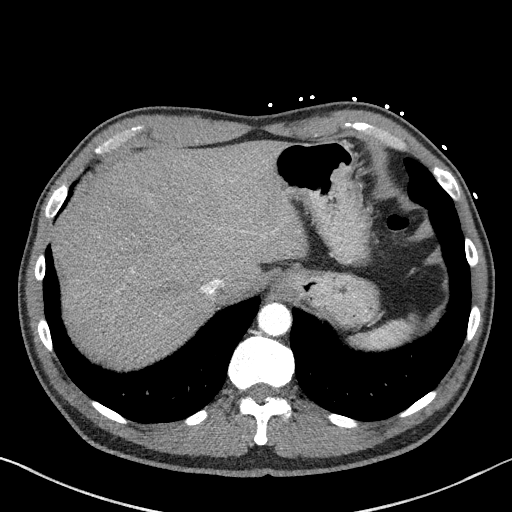
[im 35/115  lung]
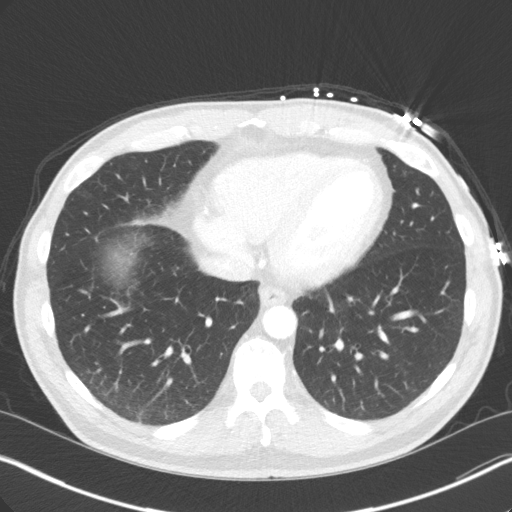
[im 46/115  soft-tissue]
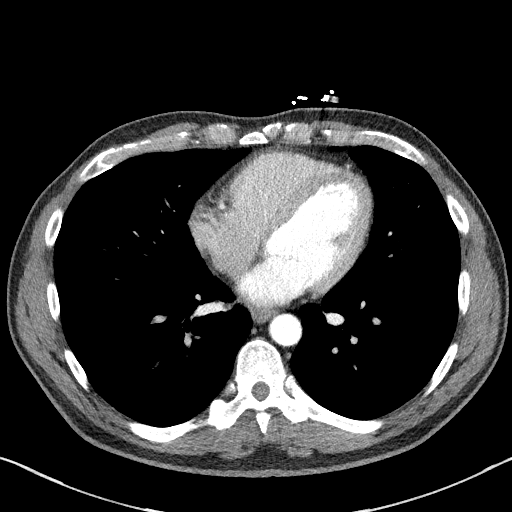
[im 58/115  lung]
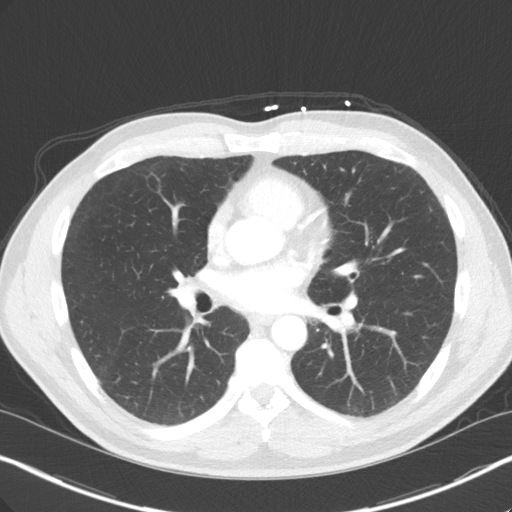
[im 69/115  soft-tissue]
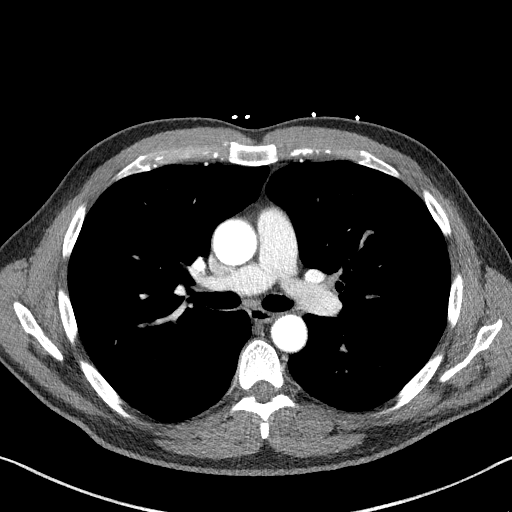
[im 80/115  lung]
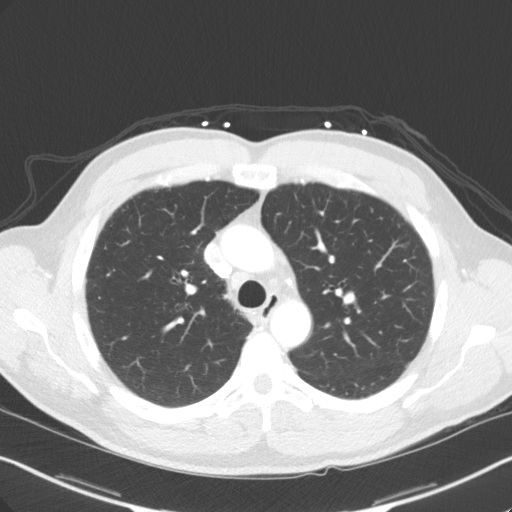
[im 92/115  soft-tissue]
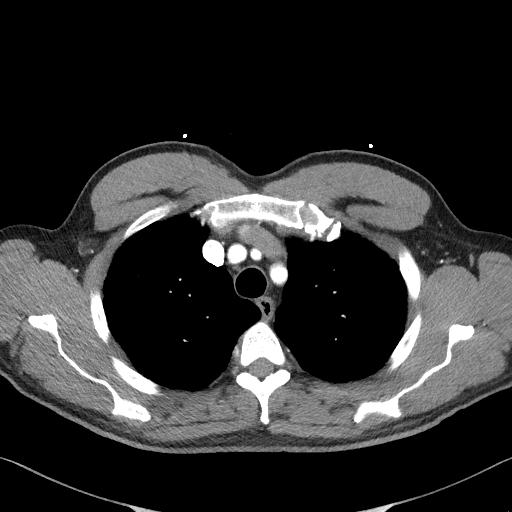
[im 103/115  lung]
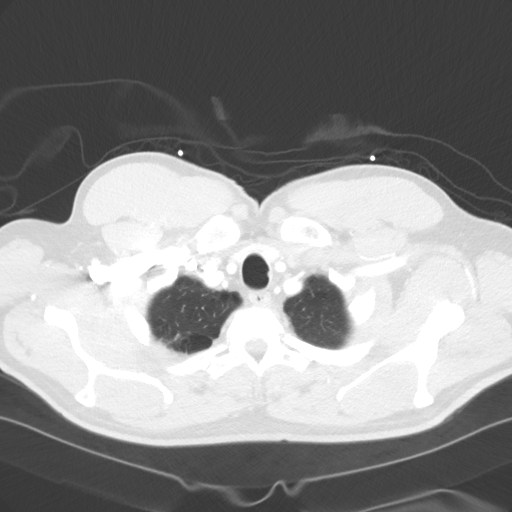

[Series 7: lung · axial · 0.70mm/px · z∈[-33,+199]mm · 7 of 163 slices shown]
[im 12/163  soft-tissue]
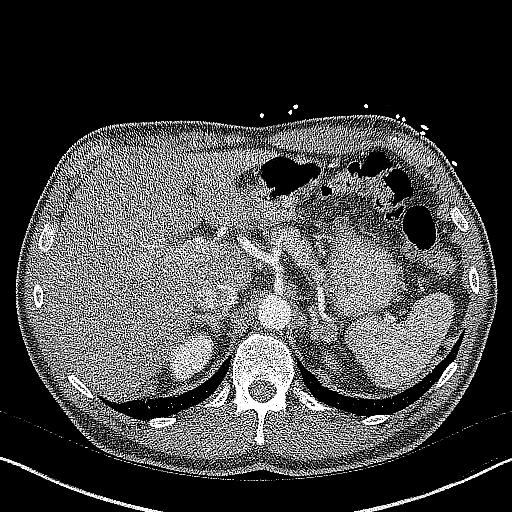
[im 35/163  soft-tissue]
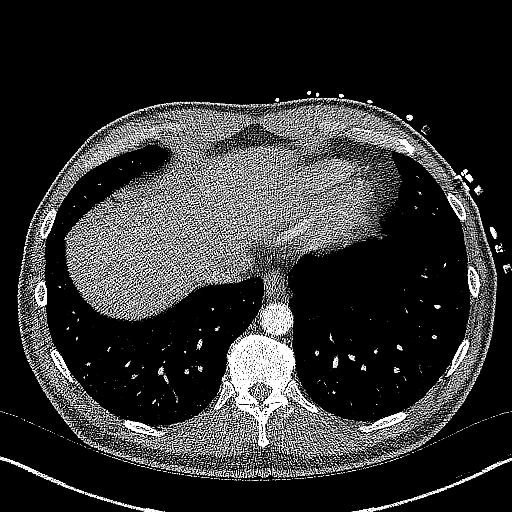
[im 58/163  soft-tissue]
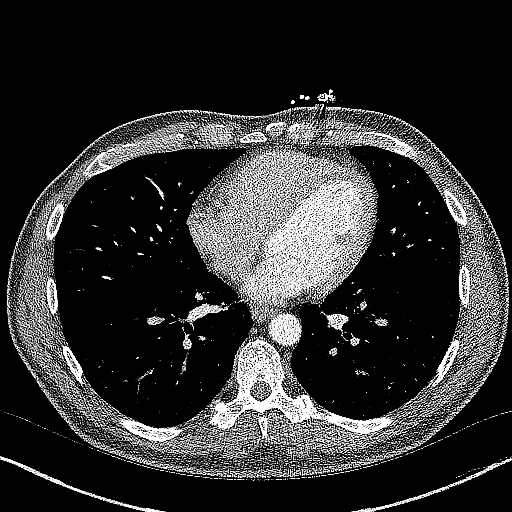
[im 70/163  soft-tissue]
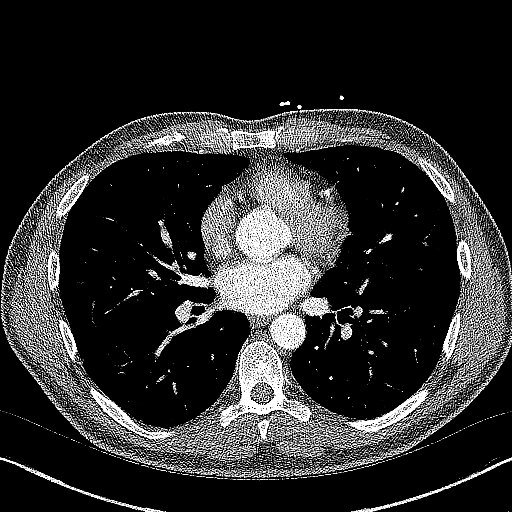
[im 93/163  soft-tissue]
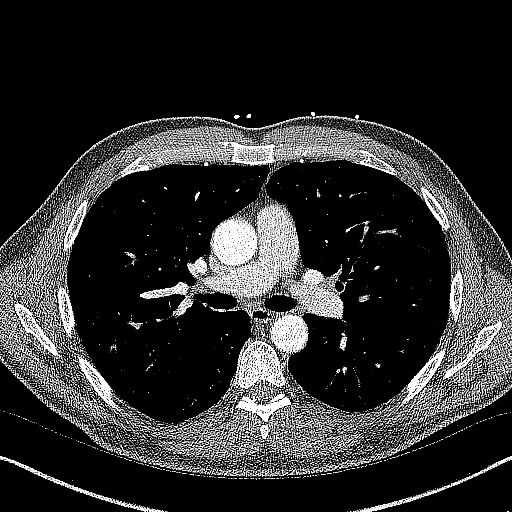
[im 105/163  soft-tissue]
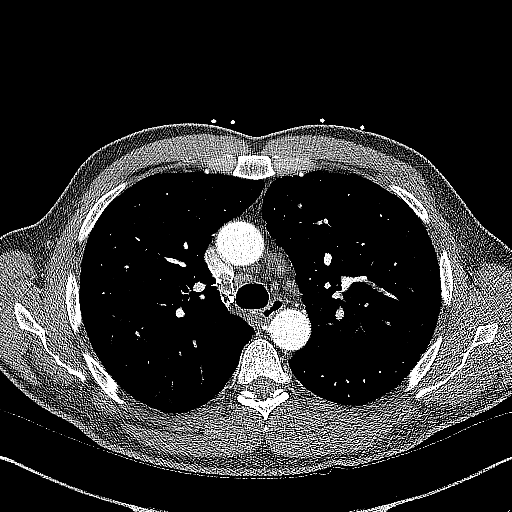
[im 128/163  soft-tissue]
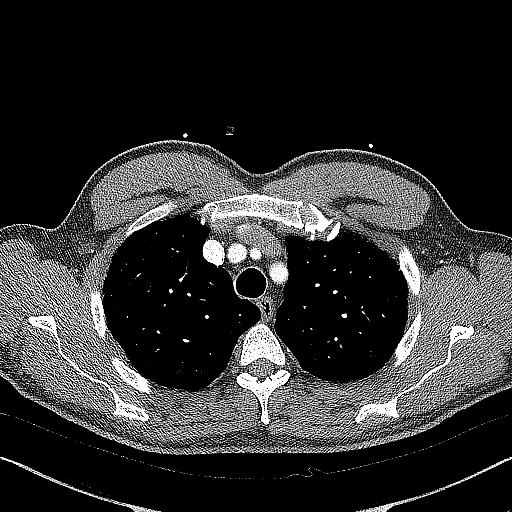

[Series 8: coronals · coronal · 0.68mm/px · 3 of 147 slices shown]
[im 37/147  soft-tissue]
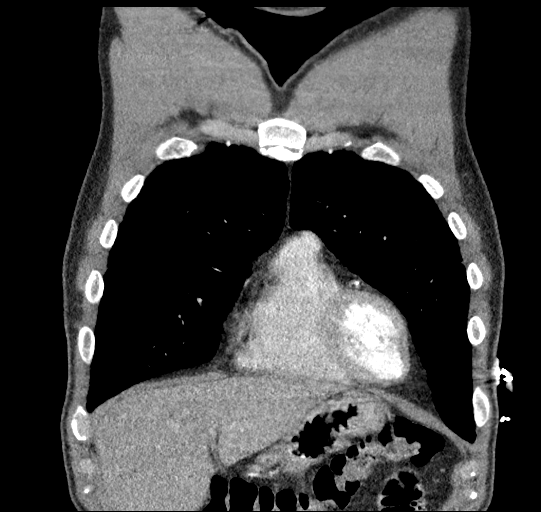
[im 74/147  soft-tissue]
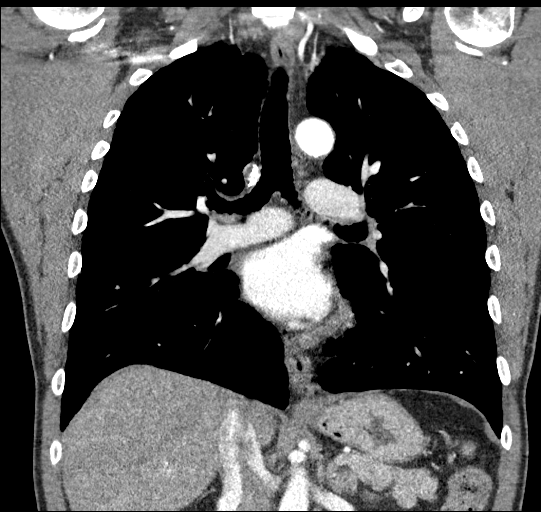
[im 110/147  soft-tissue]
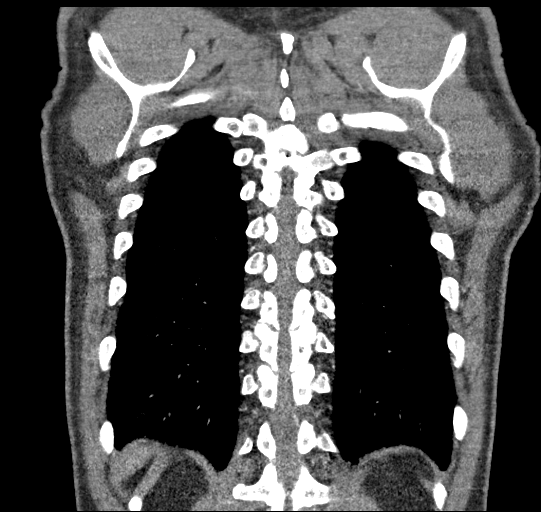

[19 of 46 positions shown; findings below may reference images not displayed]

FINDINGS: Cardiovascular: Initial noncontrast imaging demonstrates
atherosclerotic changes of aorta. No hyperdense acute intramural
hematoma appreciated.

Intact thoracic aorta. Negative for aneurysm or dissection. No
mediastinal hemorrhage or hematoma. Patent 3 vessel arch anatomy.

Central pulmonary arteries are patent.

Normal heart size. Native coronary atherosclerosis noted. No
pericardial effusion.

Mediastinum/Nodes: No enlarged mediastinal, hilar, or axillary lymph
nodes. Thyroid gland, trachea, and esophagus demonstrate no
significant findings.

Lungs/Pleura: Very minor upper lobe paraseptal emphysema changes. No
focal pneumonia, collapse or consolidation. Negative for edema or
significant interstitial disease. Trachea and central airways are
patent. Minor basilar hypoventilatory changes lower lobes. No
pleural abnormality, effusion, or pneumothorax.

Upper Abdomen: No acute abnormality.

Musculoskeletal: No acute osseous finding

Review of the MIP images confirms the above findings.
IMPRESSION: Negative for acute aortic dissection or other acute vascular process
by CTA.

No other acute intrathoracic finding

Aortic Atherosclerosis (VESTO-P1W.W) and Emphysema (VESTO-PNC.S).

## 2021-02-24 ENCOUNTER — Other Ambulatory Visit: Payer: Self-pay | Admitting: Physician Assistant

## 2021-02-24 DIAGNOSIS — J302 Other seasonal allergic rhinitis: Secondary | ICD-10-CM

## 2021-03-31 ENCOUNTER — Other Ambulatory Visit: Payer: Self-pay

## 2021-03-31 NOTE — Telephone Encounter (Signed)
Pended Rx refill request in Epic sent in by patient's pharmacy.    Re-routed pended Rx refill request for Flonase to [employer], PA-C.  AMD

## 2021-04-03 ENCOUNTER — Other Ambulatory Visit: Payer: Self-pay | Admitting: Physician Assistant

## 2021-04-03 DIAGNOSIS — J302 Other seasonal allergic rhinitis: Secondary | ICD-10-CM

## 2021-04-14 ENCOUNTER — Other Ambulatory Visit: Payer: Self-pay

## 2021-04-14 NOTE — Telephone Encounter (Signed)
Richard Walton called to follow up on Rx refill request for flonase.  Said his pharmacy told him that we haven't responded to the electronically refill request they sent.  Pharmacy sent the refill request to [facility] ED.  Went into [employer] found pended Rx refill request for Flonase & re-routed it to [employer], PA-C.  AMD

## 2021-08-17 ENCOUNTER — Ambulatory Visit: Payer: Self-pay

## 2021-08-17 DIAGNOSIS — Z0000 Encounter for general adult medical examination without abnormal findings: Secondary | ICD-10-CM

## 2021-08-17 LAB — POCT URINALYSIS DIPSTICK
Bilirubin, UA: NEGATIVE
Blood, UA: NEGATIVE
Glucose, UA: NEGATIVE
Ketones, UA: NEGATIVE
Leukocytes, UA: NEGATIVE
Nitrite, UA: NEGATIVE
Protein, UA: POSITIVE — AB
Spec Grav, UA: >=1.03 — AB (ref 1.010–1.025)
Urobilinogen, UA: 0.2 E.U./dL
pH, UA: 5 (ref 5.0–8.0)

## 2021-08-18 LAB — CMP12+LP+TP+TSH+6AC+PSA+CBC…
ALT: 48 IU/L — ABNORMAL HIGH (ref 0–44)
AST: 29 IU/L (ref 0–40)
Albumin/Globulin Ratio: 1.8 (ref 1.2–2.2)
Albumin: 4.6 g/dL (ref 3.8–4.8)
Alkaline Phosphatase: 80 IU/L (ref 44–121)
BUN/Creatinine Ratio: 16 (ref 10–24)
BUN: 15 mg/dL (ref 8–27)
Basophils Absolute: 0.1 10*3/uL (ref 0.0–0.2)
Basos: 1 %
Bilirubin Total: 0.4 mg/dL (ref 0.0–1.2)
Calcium: 9.6 mg/dL (ref 8.6–10.2)
Chloride: 101 mmol/L (ref 96–106)
Chol/HDL Ratio: 3.8 ratio (ref 0.0–5.0)
Cholesterol, Total: 166 mg/dL (ref 100–199)
Creatinine, Ser: 0.92 mg/dL (ref 0.76–1.27)
EOS (ABSOLUTE): 0.1 10*3/uL (ref 0.0–0.4)
Eos: 1 %
Estimated CHD Risk: 0.7 times avg. (ref 0.0–1.0)
Free Thyroxine Index: 1.6 (ref 1.2–4.9)
GGT: 29 IU/L (ref 0–65)
Globulin, Total: 2.6 g/dL (ref 1.5–4.5)
Glucose: 117 mg/dL — ABNORMAL HIGH (ref 70–99)
HDL: 44 mg/dL (ref >39)
Hematocrit: 45.7 % (ref 37.5–51.0)
Hemoglobin: 15.7 g/dL (ref 13.0–17.7)
Immature Grans (Abs): 0 10*3/uL (ref 0.0–0.1)
Immature Granulocytes: 0 %
Iron: 173 ug/dL — ABNORMAL HIGH (ref 38–169)
LDH: 187 IU/L (ref 121–224)
LDL Chol Calc (NIH): 94 mg/dL (ref 0–99)
Lymphocytes Absolute: 2.6 10*3/uL (ref 0.7–3.1)
Lymphs: 36 %
MCH: 32.8 pg (ref 26.6–33.0)
MCHC: 34.4 g/dL (ref 31.5–35.7)
MCV: 96 fL (ref 79–97)
Monocytes Absolute: 0.8 10*3/uL (ref 0.1–0.9)
Monocytes: 11 %
Neutrophils Absolute: 3.7 10*3/uL (ref 1.4–7.0)
Neutrophils: 51 %
Phosphorus: 2.9 mg/dL (ref 2.8–4.1)
Platelets: 277 10*3/uL (ref 150–450)
Potassium: 4.2 mmol/L (ref 3.5–5.2)
Prostate Specific Ag, Serum: 0.5 ng/mL (ref 0.0–4.0)
RBC: 4.78 x10E6/uL (ref 4.14–5.80)
RDW: 13 % (ref 11.6–15.4)
Sodium: 137 mmol/L (ref 134–144)
T3 Uptake Ratio: 26 % (ref 24–39)
T4, Total: 6.3 ug/dL (ref 4.5–12.0)
TSH: 2.08 u[IU]/mL (ref 0.450–4.500)
Total Protein: 7.2 g/dL (ref 6.0–8.5)
Triglycerides: 161 mg/dL — ABNORMAL HIGH (ref 0–149)
Uric Acid: 7.9 mg/dL (ref 3.8–8.4)
VLDL Cholesterol Cal: 28 mg/dL (ref 5–40)
WBC: 7.3 10*3/uL (ref 3.4–10.8)
eGFR: 95 mL/min/{1.73_m2} (ref >59)

## 2021-08-24 ENCOUNTER — Encounter: Payer: Self-pay | Admitting: Physician Assistant

## 2021-08-24 ENCOUNTER — Ambulatory Visit: Payer: Self-pay | Admitting: Physician Assistant

## 2021-08-24 VITALS — BP 138/96 | HR 68 | Temp 98.4°F | Resp 14 | Ht 70.0 in | Wt 220.0 lb

## 2021-08-24 DIAGNOSIS — K219 Gastro-esophageal reflux disease without esophagitis: Secondary | ICD-10-CM

## 2021-08-24 DIAGNOSIS — E785 Hyperlipidemia, unspecified: Secondary | ICD-10-CM

## 2021-08-24 DIAGNOSIS — Z0000 Encounter for general adult medical examination without abnormal findings: Secondary | ICD-10-CM

## 2021-08-24 DIAGNOSIS — I10 Essential (primary) hypertension: Secondary | ICD-10-CM

## 2021-08-24 MED ORDER — SIMVASTATIN 40 MG PO TABS: 40.0000 mg | ORAL_TABLET | Freq: Every day | ORAL | 3 refills | Status: DC

## 2021-08-24 MED ORDER — AMLODIPINE BESYLATE 10 MG PO TABS: 10.0000 mg | ORAL_TABLET | Freq: Every day | ORAL | 3 refills | Status: DC

## 2021-08-24 MED ORDER — PANTOPRAZOLE SODIUM 40 MG PO TBEC: 40.0000 mg | DELAYED_RELEASE_TABLET | Freq: Every day | ORAL | 3 refills | Status: DC

## 2022-05-26 ENCOUNTER — Other Ambulatory Visit: Payer: Self-pay | Admitting: Physician Assistant

## 2022-05-26 DIAGNOSIS — J302 Other seasonal allergic rhinitis: Secondary | ICD-10-CM

## 2022-06-18 ENCOUNTER — Other Ambulatory Visit: Payer: Self-pay | Admitting: Physician Assistant

## 2022-06-18 DIAGNOSIS — I10 Essential (primary) hypertension: Secondary | ICD-10-CM

## 2022-06-18 DIAGNOSIS — E785 Hyperlipidemia, unspecified: Secondary | ICD-10-CM

## 2022-07-15 ENCOUNTER — Ambulatory Visit: Payer: Self-pay

## 2022-07-15 DIAGNOSIS — Z0000 Encounter for general adult medical examination without abnormal findings: Secondary | ICD-10-CM

## 2022-07-15 LAB — POCT URINALYSIS DIPSTICK
Bilirubin, UA: NEGATIVE
Blood, UA: NEGATIVE
Glucose, UA: NEGATIVE
Ketones, UA: NEGATIVE
Leukocytes, UA: NEGATIVE
Nitrite, UA: NEGATIVE
Protein, UA: NEGATIVE
Spec Grav, UA: 1.025 (ref 1.010–1.025)
Urobilinogen, UA: 0.2 E.U./dL
pH, UA: 6 (ref 5.0–8.0)

## 2022-07-15 NOTE — Progress Notes (Signed)
Pt completed labs for physical. ?

## 2022-07-16 LAB — CMP12+LP+TP+TSH+6AC+PSA+CBC…
ALT: 45 IU/L — ABNORMAL HIGH (ref 0–44)
AST: 30 IU/L (ref 0–40)
Albumin/Globulin Ratio: 1.7 (ref 1.2–2.2)
Albumin: 4.3 g/dL (ref 3.9–4.9)
Alkaline Phosphatase: 77 IU/L (ref 44–121)
BUN/Creatinine Ratio: 20 (ref 10–24)
BUN: 19 mg/dL (ref 8–27)
Basophils Absolute: 0.1 10*3/uL (ref 0.0–0.2)
Basos: 1 %
Bilirubin Total: 0.3 mg/dL (ref 0.0–1.2)
Calcium: 9 mg/dL (ref 8.6–10.2)
Chloride: 102 mmol/L (ref 96–106)
Chol/HDL Ratio: 4.3 ratio (ref 0.0–5.0)
Cholesterol, Total: 163 mg/dL (ref 100–199)
Creatinine, Ser: 0.95 mg/dL (ref 0.76–1.27)
EOS (ABSOLUTE): 0.1 10*3/uL (ref 0.0–0.4)
Eos: 2 %
Estimated CHD Risk: 0.8 times avg. (ref 0.0–1.0)
Free Thyroxine Index: 1.7 (ref 1.2–4.9)
GGT: 28 IU/L (ref 0–65)
Globulin, Total: 2.6 g/dL (ref 1.5–4.5)
Glucose: 122 mg/dL — ABNORMAL HIGH (ref 70–99)
HDL: 38 mg/dL — ABNORMAL LOW (ref >39)
Hematocrit: 45.2 % (ref 37.5–51.0)
Hemoglobin: 15.4 g/dL (ref 13.0–17.7)
Immature Grans (Abs): 0 10*3/uL (ref 0.0–0.1)
Immature Granulocytes: 0 %
Iron: 140 ug/dL (ref 38–169)
LDH: 207 IU/L (ref 121–224)
LDL Chol Calc (NIH): 96 mg/dL (ref 0–99)
Lymphocytes Absolute: 2.5 10*3/uL (ref 0.7–3.1)
Lymphs: 40 %
MCH: 32.6 pg (ref 26.6–33.0)
MCHC: 34.1 g/dL (ref 31.5–35.7)
MCV: 96 fL (ref 79–97)
Monocytes Absolute: 0.8 10*3/uL (ref 0.1–0.9)
Monocytes: 12 %
Neutrophils Absolute: 2.9 10*3/uL (ref 1.4–7.0)
Neutrophils: 45 %
Phosphorus: 3.1 mg/dL (ref 2.8–4.1)
Platelets: 273 10*3/uL (ref 150–450)
Potassium: 4.4 mmol/L (ref 3.5–5.2)
Prostate Specific Ag, Serum: 0.5 ng/mL (ref 0.0–4.0)
RBC: 4.73 x10E6/uL (ref 4.14–5.80)
RDW: 12.5 % (ref 11.6–15.4)
Sodium: 138 mmol/L (ref 134–144)
T3 Uptake Ratio: 26 % (ref 24–39)
T4, Total: 6.6 ug/dL (ref 4.5–12.0)
TSH: 2.23 u[IU]/mL (ref 0.450–4.500)
Total Protein: 6.9 g/dL (ref 6.0–8.5)
Triglycerides: 164 mg/dL — ABNORMAL HIGH (ref 0–149)
Uric Acid: 6.3 mg/dL (ref 3.8–8.4)
VLDL Cholesterol Cal: 29 mg/dL (ref 5–40)
WBC: 6.4 10*3/uL (ref 3.4–10.8)
eGFR: 90 mL/min/{1.73_m2} (ref >59)

## 2022-07-18 ENCOUNTER — Ambulatory Visit: Payer: 59 | Admitting: Physician Assistant

## 2022-07-18 ENCOUNTER — Encounter: Payer: Self-pay | Admitting: Physician Assistant

## 2022-07-18 ENCOUNTER — Other Ambulatory Visit: Payer: Self-pay | Admitting: Physician Assistant

## 2022-07-18 VITALS — BP 138/90 | HR 74 | Temp 98.2°F | Resp 12 | Ht 70.0 in | Wt 215.0 lb

## 2022-07-18 DIAGNOSIS — I10 Essential (primary) hypertension: Secondary | ICD-10-CM

## 2022-07-18 DIAGNOSIS — K219 Gastro-esophageal reflux disease without esophagitis: Secondary | ICD-10-CM

## 2022-07-18 DIAGNOSIS — E785 Hyperlipidemia, unspecified: Secondary | ICD-10-CM

## 2022-07-18 DIAGNOSIS — Z0000 Encounter for general adult medical examination without abnormal findings: Secondary | ICD-10-CM

## 2022-07-18 MED ORDER — FLUTICASONE PROPIONATE 50 MCG/ACT NA SUSP: 2.0000 | Freq: Every day | NASAL | 6 refills | Status: DC

## 2022-07-18 NOTE — Addendum Note (Signed)
Addended by: Hansel Feinstein on: 07/18/2022 01:50 PM   Modules accepted: Orders

## 2022-07-18 NOTE — Addendum Note (Signed)
Addended by: Hansel Feinstein on: 07/18/2022 01:47 PM   Modules accepted: Orders

## 2022-07-18 NOTE — Progress Notes (Signed)
[facility]  ___________________________________________   None    (approximate)  I have reviewed the triage vital signs and the nursing notes.   HISTORY  Chief Complaint Annual Exam   HPI Richard Walton is a 62 y.o. male patient presents for annual physical exam.  Patient requests refill for Flonase.         Past Medical History:  Diagnosis Date   Colon polyp    Coronary artery disease    GERD (gastroesophageal reflux disease)    Hyperlipidemia    Hypertension    Seasonal allergies     Patient Active Problem List   Diagnosis Date Noted   Throat discomfort 11/28/2018   History of colonic polyps 11/28/2018   Hypertension, essential 10/30/2018   Aortic atherosclerosis (HCC) 10/30/2018   Shortness of breath 10/30/2018   Gastroesophageal reflux disease without esophagitis 10/22/2018   HLD (hyperlipidemia) 10/22/2018    Past Surgical History:  Procedure Laterality Date   ANKLE SURGERY     CLAVICLE SURGERY     COLONOSCOPY     RHINOPLASTY     ROTATOR CUFF REPAIR Bilateral     Prior to Admission medications   Medication Sig Start Date End Date Taking? Authorizing Provider  amLODipine (NORVASC) 10 MG tablet Take 1 tablet (10 mg total) by mouth daily. 08/24/21   Joni Reining, PA-C  aspirin EC 81 MG tablet Take 81 mg by mouth daily.    [provider]  fluticasone (FLONASE) 50 MCG/ACT nasal spray SPRAY 1 SPRAY INTO BOTH NOSTRILS DAILY. 04/14/21   Joni Reining, PA-C  Multiple Vitamin (MULTIVITAMIN) capsule Take 1 capsule by mouth daily.    [provider]  Omega-3 Fatty Acids (FISH OIL ODOR-LESS) 1200 MG CAPS Take by mouth.    [provider]  pantoprazole (PROTONIX) 40 MG tablet Take 1 tablet (40 mg total) by mouth daily. 08/24/21   Joni Reining, PA-C  simvastatin (ZOCOR) 40 MG tablet Take 1 tablet (40 mg total) by mouth at bedtime. 08/24/21   Joni Reining, PA-C    Allergies Patient has no known  allergies.  Family History  Problem Relation Age of Onset   Heart disease Mother    Kidney disease Father    Heart disease Brother     Social History Social History   Tobacco Use   Smoking status: Former    Types: Cigarettes   Smokeless tobacco: Never  Vaping Use   Vaping Use: Never used  Substance Use Topics   Alcohol use: Yes   Drug use: Yes    Types: Marijuana    Review of Systems Constitutional: No fever/chills Eyes: No visual changes. ENT: No sore throat. Cardiovascular: Denies chest pain. Respiratory: Denies shortness of breath.  Allergic rhinitis Gastrointestinal: No abdominal pain.  No nausea, no vomiting.  No diarrhea.  No constipation.  GERD. Genitourinary: Negative for dysuria. Musculoskeletal: Negative for back pain. Skin: Negative for rash. Neurological: Negative for headaches, focal weakness or numbness. Endocrine: Hyperlipidemia and hypertension ____________________________________________   PHYSICAL EXAM: VITAL SIGNS: BP 138/90  BP Location Left Arm  Patient Position Sitting  Cuff Size Large  Pulse 74  Resp 12  Temp 98.2 F (36.8 C)  Temp src Temporal  SpO2 95 %  Weight 215 lb (97.5 kg)  Height 5\' 10"  (1.778 m)   BMI 30.85 kg/m2  BSA 2.19 m2      Constitutional: Alert and oriented. Well appearing and in no acute distress. Eyes: Conjunctivae are  normal. PERRL. EOMI. Head: Atraumatic. Nose: No congestion/rhinnorhea. Mouth/Throat: Mucous membranes are moist.  Oropharynx non-erythematous. Neck: No stridor.  No cervical spine tenderness to palpation. Hematological/Lymphatic/Immunilogical: No cervical lymphadenopathy. Cardiovascular: Normal rate, regular rhythm. Grossly normal heart sounds.  Good peripheral circulation. Respiratory: Normal respiratory effort.  No retractions. Lungs CTAB. Gastrointestinal: Soft and nontender. No distention. No abdominal bruits. No CVA tenderness. Genitourinary: Deferred Musculoskeletal: No lower  extremity tenderness nor edema.  No joint effusions. Neurologic:  Normal speech and language. No gross focal neurologic deficits are appreciated. No gait instability. Skin:  Skin is warm, dry and intact. No rash noted. Psychiatric: Mood and affect are normal. Speech and behavior are normal.  ____________________________________________   LABS        Component Ref Range & Units 3 d ago 11 mo ago 2 yr ago 3 yr ago  Color, UA yellow Amber yellow Dark Yellow  Clarity, UA clear Clear clear Clear  Glucose, UA Negative Negative Negative Negative Negative  Bilirubin, UA neg Negative negative Negative  Ketones, UA neg Negative negative Negative  Spec Grav, UA 1.010 - 1.025 1.025 >=1.030 Abnormal  1.025 1.025  Blood, UA neg Negative negative Negative  pH, UA 5.0 - 8.0 6.0 5.0 5.0 6.0  Protein, UA Negative Negative Positive Abnormal  CM Negative Negative  Urobilinogen, UA 0.2 or 1.0 E.U./dL 0.2 0.2 0.2 0.2  Nitrite, UA neg Negative negative Negative  Leukocytes, UA Negative Negative Negative Negative Negative  Appearance   medium   Odor                    Component Ref Range & Units 3 d ago (07/15/22) 11 mo ago (08/17/21) 2 yr ago (07/16/20) 3 yr ago (07/17/19) 3 yr ago (10/25/18) 3 yr ago (10/25/18) 3 yr ago (10/16/18) 3 yr ago (10/16/18)  Glucose 70 - 99 mg/dL 696 High  295 High  284 High  R 102 High  R 127 High   79   Uric Acid 3.8 - 8.4 mg/dL 6.3 7.9 CM 7.0 CM 8.1 CM      Comment:            Therapeutic target for gout patients: <6.0  BUN 8 - 27 mg/dL 19 15 15 12  R 15 R  14 R   Creatinine, Ser 0.76 - 1.27 mg/dL 1.32 4.40 1.02 7.25 3.66 R  0.74 R   eGFR >59 mL/min/1.73 90 95 82       BUN/Creatinine Ratio 10 - 24 20 16 14 12  R      Sodium 134 - 144 mmol/L 138 137 134 134 141 R  138 R   Potassium 3.5 - 5.2 mmol/L 4.4 4.2 4.4 4.0 3.7 R  3.7 R   Chloride 96 - 106 mmol/L 102 101 97 97 103 R  105 R   Calcium 8.6 - 10.2 mg/dL 9.0 9.6 9.3 9.2 R 9.7 R  9.4 R    Phosphorus 2.8 - 4.1 mg/dL 3.1 2.9 2.4 Low  2.2 Low       Total Protein 6.0 - 8.5 g/dL 6.9 7.2 7.2 7.0 8.2 High  R     Albumin 3.9 - 4.9 g/dL 4.3 4.6 R 4.6 R 4.5 R 4.4 R     Globulin, Total 1.5 - 4.5 g/dL 2.6 2.6 2.6 2.5      Albumin/Globulin Ratio 1.2 - 2.2 1.7 1.8 1.8 1.8      Bilirubin Total 0.0 - 1.2 mg/dL 0.3 0.4 0.5 0.4 0.8 R  Alkaline Phosphatase 44 - 121 IU/L 77 80 78 64 R, CM 61 R     LDH 121 - 224 IU/L 207 187 225 High  223      AST 0 - 40 IU/L 30 29 38 37 24 R     ALT 0 - 44 IU/L 45 High  48 High  50 High  35 24 R     GGT 0 - 65 IU/L 28 29 30 23       Iron 38 - 169 ug/dL 161 096 High  045 High  155      Cholesterol, Total 100 - 199 mg/dL 409 811 914 782      Triglycerides 0 - 149 mg/dL 956 High  213 High  086 88      HDL >39 mg/dL 38 Low  44 44 51      VLDL Cholesterol Cal 5 - 40 mg/dL 29 28 25 16       LDL Chol Calc (NIH) 0 - 99 mg/dL 96 94 578 High  99      Chol/HDL Ratio 0.0 - 5.0 ratio 4.3 3.8 CM 4.0 CM 3.3 CM      Comment:                                   T. Chol/HDL Ratio                                             Men  Women                               1/2 Avg.Risk  3.4    3.3                                   Avg.Risk  5.0    4.4                                2X Avg.Risk  9.6    7.1                                3X Avg.Risk 23.4   11.0  Estimated CHD Risk 0.0 - 1.0 times avg. 0.8 0.7 CM 0.8 CM  < 0.5 CM      Comment: The CHD Risk is based on the T. Chol/HDL ratio. Other factors affect CHD Risk such as hypertension, smoking, diabetes, severe obesity, and family history of premature CHD.  TSH 0.450 - 4.500 uIU/mL 2.230 2.080 2.600 1.180      T4, Total 4.5 - 12.0 ug/dL 6.6 6.3 6.6 6.5      T3 Uptake Ratio 24 - 39 % 26 26 26 27       Free Thyroxine Index 1.2 - 4.9 1.7 1.6 1.7 1.8      Prostate Specific Ag, Serum 0.0 - 4.0 ng/mL 0.5 0.5 CM 0.6 CM 0.6 CM      Comment: Roche ECLIA methodology. According to the American Urological  Association, Serum PSA should decrease and remain at undetectable levels after radical prostatectomy. The AUA  defines biochemical recurrence as an initial PSA value 0.2 ng/mL or greater followed by a subsequent confirmatory PSA value 0.2 ng/mL or greater. Values obtained with different assay methods or kits cannot be used interchangeably. Results cannot be interpreted as absolute evidence of the presence or absence of malignant disease.  WBC 3.4 - 10.8 x10E3/uL 6.4 7.3 7.0 8.4  9.2 R  8.5 R  RBC 4.14 - 5.80 x10E6/uL 4.73 4.78 4.52 4.51  4.69 R  4.53 R  Hemoglobin 13.0 - 17.7 g/dL 16.1 09.6 04.5 40.9  81.1 R  14.7 R  Hematocrit 37.5 - 51.0 % 45.2 45.7 42.3 43.8  44.6 R  44.1 R  MCV 79 - 97 fL 96 96 94 97  95.1 R  97.4 R  MCH 26.6 - 33.0 pg 32.6 32.8 32.7 32.8  32.6 R  32.5 R  MCHC 31.5 - 35.7 g/dL 91.4 78.2 95.6 21.3  08.6 R  33.3 R  RDW 11.6 - 15.4 % 12.5 13.0 13.1 13.1  12.2 R  12.5 R  Platelets 150 - 450 x10E3/uL 273 277 301 288  260 R  269 R  Neutrophils Not Estab. % 45 51 49 64  74 R    Lymphs Not Estab. % 40 36 37 25      Monocytes Not Estab. % 12 11 11 10       Eos Not Estab. % 2 1 2  0      Basos Not Estab. % 1 1 1 1       Neutrophils Absolute 1.4 - 7.0 x10E3/uL 2.9 3.7 3.4 5.3  6.8 R    Lymphocytes Absolute 0.7 - 3.1 x10E3/uL 2.5 2.6 2.6 2.1  1.5 R    Monocytes Absolute 0.1 - 0.9 x10E3/uL 0.8 0.8 0.8 0.8      EOS (ABSOLUTE) 0.0 - 0.4 x10E3/uL 0.1 0.1 0.2 0.0      Basophils Absolute 0.0 - 0.2 x10E3/uL 0.1 0.1 0.1 0.1  0.1 R    Immature Granulocytes Not Estab. % 0 0 0 0  0 R    Immature Grans (Abs)              ____________________________________________  EKG  Bradycardic at 57 bpm ____________________________________________    ____________________________________________   INITIAL IMPRESSION / ASSESSMENT AND PLAN  As part of my medical decision making, I reviewed the following data within the electronic MEDICAL RECORD NUMBER      No acute findings  on physical exam.  Discussed lab results showing increase glucose and triglycerides.  Hemoglobin A1c is pending.  Patient is amenable to a diet and exercise and the medication at this time.  Patient will follow-up in 3 months.        ____________________________________________   FINAL CLINICAL IMPRESSION    ED Discharge Orders     None        Note:  This document was prepared using Dragon voice recognition software and may include unintentional dictation errors.

## 2022-07-20 LAB — SPECIMEN STATUS REPORT

## 2022-07-20 LAB — HGB A1C W/O EAG: Hgb A1c MFr Bld: 7 % — ABNORMAL HIGH (ref 4.8–5.6)

## 2022-08-27 ENCOUNTER — Other Ambulatory Visit: Payer: Self-pay | Admitting: Physician Assistant

## 2022-08-27 DIAGNOSIS — K219 Gastro-esophageal reflux disease without esophagitis: Secondary | ICD-10-CM

## 2022-08-29 ENCOUNTER — Other Ambulatory Visit: Payer: Self-pay

## 2022-08-29 DIAGNOSIS — I10 Essential (primary) hypertension: Secondary | ICD-10-CM

## 2022-08-29 DIAGNOSIS — K219 Gastro-esophageal reflux disease without esophagitis: Secondary | ICD-10-CM

## 2022-08-29 DIAGNOSIS — E785 Hyperlipidemia, unspecified: Secondary | ICD-10-CM

## 2022-08-29 MED ORDER — PANTOPRAZOLE SODIUM 40 MG PO TBEC
40.0000 mg | DELAYED_RELEASE_TABLET | Freq: Every day | ORAL | 3 refills | Status: DC
Start: 2022-08-29 — End: 2023-06-01

## 2022-08-29 MED ORDER — SIMVASTATIN 40 MG PO TABS
40.0000 mg | ORAL_TABLET | Freq: Every day | ORAL | 3 refills | Status: DC
Start: 2022-08-29 — End: 2023-06-01

## 2022-08-29 MED ORDER — AMLODIPINE BESYLATE 10 MG PO TABS: 10.0000 mg | ORAL_TABLET | Freq: Every day | ORAL | 3 refills | Status: DC

## 2022-11-15 ENCOUNTER — Encounter: Payer: Self-pay | Admitting: Physician Assistant

## 2022-11-15 ENCOUNTER — Ambulatory Visit: Payer: Self-pay | Admitting: Physician Assistant

## 2022-11-15 DIAGNOSIS — L237 Allergic contact dermatitis due to plants, except food: Secondary | ICD-10-CM

## 2022-11-15 MED ORDER — HYDROCORTISONE VALERATE 0.2 % EX CREA: 1.0000 | TOPICAL_CREAM | Freq: Two times a day (BID) | CUTANEOUS | 0 refills | Status: DC

## 2022-11-15 MED ORDER — METHYLPREDNISOLONE 4 MG PO TBPK: ORAL_TABLET | ORAL | 0 refills | Status: DC

## 2022-11-15 MED ORDER — HYDROXYZINE PAMOATE 25 MG PO CAPS: 25.0000 mg | ORAL_CAPSULE | Freq: Three times a day (TID) | ORAL | 0 refills | Status: DC | PRN

## 2022-11-15 NOTE — Progress Notes (Signed)
Pt has rash on upper body only. Both arms and stomach. X 3 days. Pt describes it very itchy.

## 2022-11-15 NOTE — Progress Notes (Signed)
Subjective: Rash    Patient ID: Richard Walton, male    DOB: [birth year], 62 y.o.   MRN: [id number]  HPI Patient complaining of rash to the upper anterior part of his body.  Patient believes came in contact some brush on the side of his house 3 days ago..  States intense itching.   Review of Systems GERD, hyperlipidemia, and hypertension.    Objective:   Physical Exam  Patient has multiple papular vesicles lesion on the upper torso and bilateral arms.  Is a satellite lesion on the left periorbital area.  Area slightly erythematous.  Signs of excoriation.  No signs of secondary infection.      Assessment & Plan: Contact dermatitis  Patient given prescription for Medrol Dosepak, Westcort, and Atarax.  Advised to follow-up in 1 week if there is no improvement or worsening complaints.

## 2023-01-09 ENCOUNTER — Other Ambulatory Visit: Payer: Self-pay | Admitting: Physician Assistant

## 2023-01-09 ENCOUNTER — Encounter: Payer: Self-pay | Admitting: Physician Assistant

## 2023-01-09 MED ORDER — CLARITIN-D 12 HOUR 5-120 MG PO TB12
1.0000 | ORAL_TABLET | Freq: Two times a day (BID) | ORAL | 0 refills | Status: DC
Start: 2023-01-09 — End: 2023-06-01

## 2023-01-09 MED ORDER — AMOXICILLIN 500 MG PO CAPS: 500.0000 mg | ORAL_CAPSULE | Freq: Three times a day (TID) | ORAL | 0 refills | Status: DC

## 2023-02-12 ENCOUNTER — Other Ambulatory Visit: Payer: Self-pay | Admitting: Physician Assistant

## 2023-03-31 ENCOUNTER — Other Ambulatory Visit: Payer: Self-pay

## 2023-03-31 DIAGNOSIS — Z889 Allergy status to unspecified drugs, medicaments and biological substances status: Secondary | ICD-10-CM

## 2023-03-31 MED ORDER — FLUTICASONE PROPIONATE 50 MCG/ACT NA SUSP
2.0000 | Freq: Every day | NASAL | 6 refills | Status: DC
Start: 2023-03-31 — End: 2023-11-14

## 2023-05-26 ENCOUNTER — Ambulatory Visit: Payer: Self-pay

## 2023-05-26 DIAGNOSIS — R7303 Prediabetes: Secondary | ICD-10-CM

## 2023-05-26 DIAGNOSIS — Z0000 Encounter for general adult medical examination without abnormal findings: Secondary | ICD-10-CM

## 2023-05-26 LAB — POCT URINALYSIS DIPSTICK
Bilirubin, UA: NEGATIVE
Blood, UA: NEGATIVE
Glucose, UA: NEGATIVE
Ketones, UA: NEGATIVE
Leukocytes, UA: NEGATIVE
Nitrite, UA: NEGATIVE
Protein, UA: NEGATIVE
Spec Grav, UA: 1.01 (ref 1.010–1.025)
Urobilinogen, UA: 0.2 U/dL
pH, UA: 6 (ref 5.0–8.0)

## 2023-05-27 LAB — CMP12+LP+TP+TSH+6AC+PSA+CBC…
ALT: 49 IU/L — ABNORMAL HIGH (ref 0–44)
AST: 37 IU/L (ref 0–40)
Albumin: 4.5 g/dL (ref 3.9–4.9)
Alkaline Phosphatase: 77 IU/L (ref 44–121)
BUN/Creatinine Ratio: 20 (ref 10–24)
BUN: 17 mg/dL (ref 8–27)
Basophils Absolute: 0.1 10*3/uL (ref 0.0–0.2)
Basos: 1 %
Bilirubin Total: 0.5 mg/dL (ref 0.0–1.2)
Calcium: 9.4 mg/dL (ref 8.6–10.2)
Chloride: 103 mmol/L (ref 96–106)
Chol/HDL Ratio: 3.8 ratio (ref 0.0–5.0)
Cholesterol, Total: 168 mg/dL (ref 100–199)
Creatinine, Ser: 0.85 mg/dL (ref 0.76–1.27)
EOS (ABSOLUTE): 0.2 10*3/uL (ref 0.0–0.4)
Eos: 2 %
Estimated CHD Risk: 0.7 times avg. (ref 0.0–1.0)
Free Thyroxine Index: 2 (ref 1.2–4.9)
GGT: 31 IU/L (ref 0–65)
Globulin, Total: 2.8 g/dL (ref 1.5–4.5)
Glucose: 119 mg/dL — ABNORMAL HIGH (ref 70–99)
HDL: 44 mg/dL (ref >39)
Hematocrit: 48.7 % (ref 37.5–51.0)
Hemoglobin: 16.6 g/dL (ref 13.0–17.7)
Immature Grans (Abs): 0 10*3/uL (ref 0.0–0.1)
Immature Granulocytes: 0 %
Iron: 164 ug/dL (ref 38–169)
LDH: 201 IU/L (ref 121–224)
LDL Chol Calc (NIH): 98 mg/dL (ref 0–99)
Lymphocytes Absolute: 3 10*3/uL (ref 0.7–3.1)
Lymphs: 45 %
MCH: 32.5 pg (ref 26.6–33.0)
MCHC: 34.1 g/dL (ref 31.5–35.7)
MCV: 95 fL (ref 79–97)
Monocytes Absolute: 0.7 10*3/uL (ref 0.1–0.9)
Monocytes: 11 %
Neutrophils Absolute: 2.8 10*3/uL (ref 1.4–7.0)
Neutrophils: 41 %
Phosphorus: 3.6 mg/dL (ref 2.8–4.1)
Platelets: 280 10*3/uL (ref 150–450)
Potassium: 4.4 mmol/L (ref 3.5–5.2)
Prostate Specific Ag, Serum: 0.5 ng/mL (ref 0.0–4.0)
RBC: 5.11 x10E6/uL (ref 4.14–5.80)
RDW: 12.5 % (ref 11.6–15.4)
Sodium: 140 mmol/L (ref 134–144)
T3 Uptake Ratio: 26 % (ref 24–39)
T4, Total: 7.8 ug/dL (ref 4.5–12.0)
TSH: 2.59 u[IU]/mL (ref 0.450–4.500)
Total Protein: 7.3 g/dL (ref 6.0–8.5)
Triglycerides: 145 mg/dL (ref 0–149)
Uric Acid: 5.7 mg/dL (ref 3.8–8.4)
VLDL Cholesterol Cal: 26 mg/dL (ref 5–40)
WBC: 6.8 10*3/uL (ref 3.4–10.8)
eGFR: 98 mL/min/{1.73_m2} (ref >59)

## 2023-05-27 LAB — HGB A1C W/O EAG: Hgb A1c MFr Bld: 7.7 % — ABNORMAL HIGH (ref 4.8–5.6)

## 2023-06-01 ENCOUNTER — Other Ambulatory Visit: Payer: Self-pay

## 2023-06-01 ENCOUNTER — Ambulatory Visit: Payer: Self-pay | Admitting: Physician Assistant

## 2023-06-01 ENCOUNTER — Encounter: Payer: Self-pay | Admitting: Physician Assistant

## 2023-06-01 VITALS — BP 142/94 | HR 72 | Temp 97.8°F | Resp 16 | Wt 215.0 lb

## 2023-06-01 DIAGNOSIS — R7303 Prediabetes: Secondary | ICD-10-CM

## 2023-06-01 DIAGNOSIS — I10 Essential (primary) hypertension: Secondary | ICD-10-CM

## 2023-06-01 DIAGNOSIS — Z0000 Encounter for general adult medical examination without abnormal findings: Secondary | ICD-10-CM

## 2023-06-01 DIAGNOSIS — E785 Hyperlipidemia, unspecified: Secondary | ICD-10-CM

## 2023-06-01 DIAGNOSIS — K219 Gastro-esophageal reflux disease without esophagitis: Secondary | ICD-10-CM

## 2023-06-01 DIAGNOSIS — E0800 Diabetes mellitus due to underlying condition with hyperosmolarity without nonketotic hyperglycemic-hyperosmolar coma (NKHHC): Secondary | ICD-10-CM

## 2023-06-01 LAB — GLUCOSE, POCT (MANUAL RESULT ENTRY): POC Glucose: 116 mg/dL — AB (ref 70–99)

## 2023-06-01 MED ORDER — PANTOPRAZOLE SODIUM 40 MG PO TBEC: 40.0000 mg | DELAYED_RELEASE_TABLET | Freq: Every day | ORAL | 3 refills | Status: AC

## 2023-06-01 MED ORDER — SIMVASTATIN 40 MG PO TABS: 40.0000 mg | ORAL_TABLET | Freq: Every day | ORAL | 3 refills | Status: AC

## 2023-06-01 MED ORDER — METFORMIN HCL 500 MG PO TABS: 500.0000 mg | ORAL_TABLET | Freq: Two times a day (BID) | ORAL | 3 refills | Status: AC

## 2023-06-01 MED ORDER — AMLODIPINE BESYLATE 10 MG PO TABS: 10.0000 mg | ORAL_TABLET | Freq: Every day | ORAL | 3 refills | Status: AC

## 2023-06-01 NOTE — Progress Notes (Signed)
 Herre for yearly physical with provider, retired [employer].  Stated aware of labs and working on lowering lipids.  Stated he is going to the gym 3 times a day.

## 2023-06-01 NOTE — Progress Notes (Signed)
 [facility]   ____________________________________________   None    (approximate)  I have reviewed the triage vital signs and the nursing notes.   HISTORY  Chief Complaint No chief complaint on file.   HPI Richard Walton is a 63 y.o. male patient presents for annual physical exam.  Patient is aware of increased hemoglobin A1c even though he has tried 6 months of diet and exercise.        ASSOCIATEDSYMPTOMS (pertinent positives and negatives)**} Past Medical History:  Diagnosis Date   Colon polyp    Coronary artery disease    GERD (gastroesophageal reflux disease)    Hyperlipidemia    Hypertension    Seasonal allergies     Patient Active Problem List   Diagnosis Date Noted   Throat discomfort 11/28/2018   History of colonic polyps 11/28/2018   Hypertension, essential 10/30/2018   Aortic atherosclerosis (HCC) 10/30/2018   Shortness of breath 10/30/2018   Gastroesophageal reflux disease without esophagitis 10/22/2018   HLD (hyperlipidemia) 10/22/2018   H/O seasonal allergies 10/06/2014    Past Surgical History:  Procedure Laterality Date   ANKLE SURGERY     CLAVICLE SURGERY     COLONOSCOPY     RHINOPLASTY     ROTATOR CUFF REPAIR Bilateral     Prior to Admission medications   Medication Sig Start Date End Date Taking? Authorizing Provider  amLODipine (NORVASC) 10 MG tablet Take 1 tablet (10 mg total) by mouth daily. 08/29/22  Yes Joni Reining, PA-C  aspirin EC 81 MG tablet Take 81 mg by mouth daily.   Yes [provider]  fluticasone (FLONASE) 50 MCG/ACT nasal spray Place 2 sprays into both nostrils daily. 03/31/23  Yes Ivar Drape, PA-C  Multiple Vitamin (MULTIVITAMIN) capsule Take 1 capsule by mouth daily.   Yes [provider]  Omega-3 Fatty Acids (FISH OIL ODOR-LESS) 1200 MG CAPS Take by mouth.   Yes [provider]  pantoprazole (PROTONIX) 40 MG tablet Take 1 tablet (40 mg total) by mouth  daily. 08/29/22  Yes Joni Reining, PA-C  simvastatin (ZOCOR) 40 MG tablet Take 1 tablet (40 mg total) by mouth at bedtime. 08/29/22  Yes Joni Reining, PA-C    Allergies Patient has no known allergies.  Family History  Problem Relation Age of Onset   Heart disease Mother    Kidney disease Father    Heart disease Brother     Social History Social History   Tobacco Use   Smoking status: Former    Types: Cigarettes   Smokeless tobacco: Never  Vaping Use   Vaping status: Never Used  Substance Use Topics   Alcohol use: Yes   Drug use: Yes    Types: Marijuana    Review of Systems Constitutional: No fever/chills Eyes: No visual changes. ENT: No sore throat. Cardiovascular: Denies chest pain. Respiratory: Denies shortness of breath. Gastrointestinal: No abdominal pain.  No nausea, no vomiting.  No diarrhea.  No constipation.  GERD. Genitourinary: Negative for dysuria. Musculoskeletal: Negative for back pain. Skin: Negative for rash. Neurological: Negative for headaches, focal weakness or numbness. Endocrine: Hypertension.   ____________________________________________   PHYSICAL EXAM:  VITAL SIGNS: BP 142/94BP. 142/94. Data is abnormal. Taken on 06/01/23 8:57 AM  Pulse Rate 72  Temp 97.8 F (36.6 C)  Weight 215 lb (97.5 kg)  Resp 16  SpO2 95 %   BMI: 30.85 kg/m2  BSA: 2.19 m2   Constitutional: Alert and oriented. Well  appearing and in no acute distress. Eyes: Conjunctivae are normal. PERRL. EOMI. Head: Atraumatic. Nose: No congestion/rhinnorhea. Mouth/Throat: Mucous membranes are moist.  Oropharynx non-erythematous. Neck: No stridor.No cervical spine tenderness to palpation. Hematological/Lymphatic/Immunilogical: No cervical lymphadenopathy. Cardiovascular: Normal rate, regular rhythm. Grossly normal heart sounds.  Good peripheral circulation. Respiratory: Normal respiratory effort.  No retractions. Lungs CTAB. Gastrointestinal: Soft and nontender. No  distention. No abdominal bruits. No CVA tenderness. Genitourinary: Deferred Musculoskeletal: No lower extremity tenderness nor edema.  No joint effusions. Neurologic:  Normal speech and language. No gross focal neurologic deficits are appreciated. No gait instability. Skin:  Skin is warm, dry and intact. No rash noted. Psychiatric: Mood and affect are normal. Speech and behavior are normal.  ____________________________________________   LABS  Ref Range & Units (hover) 09:12  POC Glucose 116 Abnormal             Component Ref Range & Units (hover) 6 d ago 10 mo ago 1 yr ago 2 yr ago 3 yr ago  Color, UA Yellow yellow Amber yellow Dark Yellow  Clarity, UA Clear clear Clear clear Clear  Glucose, UA Negative Negative Negative Negative Negative  Bilirubin, UA Negative neg Negative negative Negative  Ketones, UA Negative neg Negative negative Negative  Spec Grav, UA 1.010 1.025 >=1.030 Abnormal  1.025 1.025  Blood, UA Negative neg Negative negative Negative  pH, UA 6.0 6.0 5.0 5.0 6.0  Protein, UA Negative Negative Positive Abnormal  CM Negative Negative  Urobilinogen, UA 0.2 0.2 0.2 0.2 0.2  Nitrite, UA Negative neg Negative negative Negative  Leukocytes, UA Negative Negative Negative Negative Negative  Appearance    medium   Odor                  View All Conversations on this Encounter              Component Ref Range & Units (hover) 6 d ago (05/26/23) 10 mo ago (07/15/22) 1 yr ago (08/17/21) 2 yr ago (07/16/20) 3 yr ago (07/17/19) 4 yr ago (10/25/18) 4 yr ago (10/25/18)  Glucose 119 High  122 High  117 High  102 High  R 102 High  R 127 High    Uric Acid 5.7 6.3 CM 7.9 CM 7.0 CM 8.1 CM    Comment:            Therapeutic target for gout patients: <6.0  BUN 17 19 15 15 12  R 15 R   Creatinine, Ser 0.85 0.95 0.92 1.04 0.97 0.69 R   eGFR 98 90 95 82     BUN/Creatinine Ratio 20 20 16 14 12  R    Sodium 140 138 137 134 134 141 R   Potassium 4.4 4.4 4.2 4.4 4.0 3.7 R    Chloride 103 102 101 97 97 103 R   Calcium 9.4 9.0 9.6 9.3 9.2 R 9.7 R   Phosphorus 3.6 3.1 2.9 2.4 Low  2.2 Low     Total Protein 7.3 6.9 7.2 7.2 7.0 8.2 High  R   Albumin 4.5 4.3 4.6 R 4.6 R 4.5 R 4.4 R   Globulin, Total 2.8 2.6 2.6 2.6 2.5    Bilirubin Total 0.5 0.3 0.4 0.5 0.4 0.8 R   Alkaline Phosphatase 77 77 80 78 64 R, CM 61 R   LDH 201 207 187 225 High  223    AST 37 30 29 38 37 24 R   ALT 49 High  45 High  48 High  50 High  35 24 R   GGT 31 28 29 30 23     Iron 164 140 173 High  197 High  155    Cholesterol, Total 168 163 166 178 166    Triglycerides 145 164 High  161 High  140 88    HDL 44 38 Low  44 44 51    VLDL Cholesterol Cal 26 29 28 25 16     LDL Chol Calc (NIH) 98 96 94 109 High  99    Chol/HDL Ratio 3.8 4.3 CM 3.8 CM 4.0 CM 3.3 CM    Comment:                                   T. Chol/HDL Ratio                                             Men  Women                               1/2 Avg.Risk  3.4    3.3                                   Avg.Risk  5.0    4.4                                2X Avg.Risk  9.6    7.1                                3X Avg.Risk 23.4   11.0  Estimated CHD Risk 0.7 0.8 CM 0.7 CM 0.8 CM  < 0.5 CM    Comment: The CHD Risk is based on the T. Chol/HDL ratio. Other factors affect CHD Risk such as hypertension, smoking, diabetes, severe obesity, and family history of premature CHD.  TSH 2.590 2.230 2.080 2.600 1.180    T4, Total 7.8 6.6 6.3 6.6 6.5    T3 Uptake Ratio 26 26 26 26 27     Free Thyroxine Index 2.0 1.7 1.6 1.7 1.8    Prostate Specific Ag, Serum 0.5 0.5 CM 0.5 CM 0.6 CM 0.6 CM    Comment: Roche ECLIA methodology. According to the American Urological Association, Serum PSA should decrease and remain at undetectable levels after radical prostatectomy. The AUA defines biochemical recurrence as an initial PSA value 0.2 ng/mL or greater followed by a subsequent confirmatory PSA value 0.2 ng/mL or greater. Values obtained with different  assay methods or kits cannot be used interchangeably. Results cannot be interpreted as absolute evidence of the presence or absence of malignant disease.  WBC 6.8 6.4 7.3 7.0 8.4  9.2 R  RBC 5.11 4.73 4.78 4.52 4.51  4.69 R  Hemoglobin 16.6 15.4 15.7 14.8 14.8  15.3 R  Hematocrit 48.7 45.2 45.7 42.3 43.8  44.6 R  MCV 95 96 96 94 97  95.1 R  MCH 32.5 32.6 32.8 32.7 32.8  32.6 R  MCHC 34.1 34.1 34.4 35.0 33.8  34.3 R  RDW 12.5 12.5 13.0 13.1 13.1  12.2 R  Platelets 280 273 277 301 288  260 R  Neutrophils 41 45 51 49 64  74 R  Lymphs 45 40 36 37 25    Monocytes 11 12 11 11 10     Eos 2 2 1 2  0    Basos 1 1 1 1 1     Neutrophils Absolute 2.8 2.9 3.7 3.4 5.3  6.8 R  Lymphocytes Absolute 3.0 2.5 2.6 2.6 2.1  1.5 R  Monocytes Absolute 0.7 0.8 0.8 0.8 0.8    EOS (ABSOLUTE) 0.2 0.1 0.1 0.2 0.0    Basophils Absolute 0.1 0.1 0.1 0.1 0.1  0.1 R  Immature Granulocytes 0 0 0 0 0  0 R  Immature Grans (Abs) 0.0 0.0 0.0 0.0 0.0    Resulting Agency [facility]          View All Conversations on this Encounter           Component Ref Range & Units (hover) 6 d ago 10 mo ago  Hgb A1c MFr Bld 7.7 High  7.0 High  CM  Comment:          Prediabetes: 5.7 - 6.4          Diabetes: >6.4          Glycemic control for adults with diabetes: <7.0              ____________________________________________  EKG  Sinus bradycardia 59 ____________________________________________    ____________________________________________   INITIAL IMPRESSION / ASSESSMENT AND PLAN As part of my medical decision making, I reviewed the following data within the electronic MEDICAL RECORD NUMBER      No acute distress on physical exam and EKG.  Labs reveal hemoglobin A1c of 7.7.  This is increased from 7.0 last year.  Patient was started metformin 500 mg twice daily and follow-up with a hemoglobin A1c in 3 months.         ____________________________________________   FINAL CLINICAL IMPRESSION   Well exam   ED Discharge Orders     None        Note:  This document was prepared using Dragon voice recognition software and may include unintentional dictation errors.

## 2023-06-19 ENCOUNTER — Encounter: Payer: Self-pay | Admitting: Physician Assistant

## 2023-06-19 ENCOUNTER — Encounter

## 2023-06-19 ENCOUNTER — Ambulatory Visit: Payer: Self-pay | Admitting: Physician Assistant

## 2023-06-19 VITALS — BP 139/89 | HR 71 | Temp 98.7°F | Resp 16

## 2023-06-19 DIAGNOSIS — R300 Dysuria: Secondary | ICD-10-CM

## 2023-06-19 LAB — POCT URINALYSIS DIPSTICK
Bilirubin, UA: NEGATIVE
Glucose, UA: NEGATIVE
Ketones, UA: NEGATIVE
Leukocytes, UA: NEGATIVE
Nitrite, UA: NEGATIVE
Protein, UA: NEGATIVE
Spec Grav, UA: 1.015 (ref 1.010–1.025)
Urobilinogen, UA: 0.2 U/dL
pH, UA: 5.5 (ref 5.0–8.0)

## 2023-06-19 NOTE — Addendum Note (Signed)
 Addended by: Walt Gunner on: 06/19/2023 01:51 PM   Modules accepted: Orders

## 2023-06-19 NOTE — Progress Notes (Signed)
  Subjective: Dysuria     Patient ID: Richard Walton, male   DOB: Dec 25, 1960, 63 y.o.   MRN: [id number]  HPI Patient complaining of dysuria for 1 week.  Patient states similar episode few years ago which was diagnosed as a kidney stone.  Patient denies hematuria or urgency.  Patient denies flank pain.  No fever.  Patient PSA has been within normal limits.  Review of Systems Richard Walton, hyperlipidemia, and hypertension.    Objective:   Physical Exam BP 139/89  Pulse Rate 71  Temp 98.7 F (37.1 C)  Resp 16  SpO2 95 %           Component Ref Range & Units (hover) 13:06 3 wk ago 11 mo ago 1 yr ago 2 yr ago 3 yr ago  Color, UA yellow Yellow yellow Amber yellow Dark Yellow  Clarity, UA clear Clear clear Clear clear Clear  Glucose, UA Negative Negative Negative Negative Negative Negative  Bilirubin, UA neg Negative neg Negative negative Negative  Ketones, UA neg Negative neg Negative negative Negative  Spec Grav, UA 1.015 1.010 1.025 >=1.030 Abnormal  1.025 1.025  Blood, UA 1+ Negative neg Negative negative Negative  pH, UA 5.5 6.0 6.0 5.0 5.0 6.0  Protein, UA Negative Negative Negative Positive Abnormal  CM Negative Negative  Urobilinogen, UA 0.2 0.2 0.2 0.2 0.2 0.2  Nitrite, UA neg Negative neg Negative negative Negative  Leukocytes, UA Negative Negative Negative Negative Negative Negative  Appearance     medium   Odor                    Assessment:     Dysuria    Plan:     Patient will consult urology.  Advised patient to return here or go to the emergency room if they increased urgency frequency or retention.

## 2023-06-19 NOTE — Progress Notes (Signed)
 Reports intermittent x approximately one week of at the end of urination a feeling of pain/discomfort at the end of the stream.  Denies trouble starting the urination.  No fever and taking PO well.  Stated one time of undisclosed time in his life he thinks maybe he had a kidney stone undiagnosed with intense doubling over pain during urination.  UA obtained today in clinic.

## 2023-08-02 ENCOUNTER — Other Ambulatory Visit: Payer: Self-pay

## 2023-08-03 ENCOUNTER — Ambulatory Visit (INDEPENDENT_AMBULATORY_CARE_PROVIDER_SITE_OTHER): Payer: Self-pay | Admitting: Urology

## 2023-08-03 VITALS — BP 158/91 | HR 71 | Ht 70.0 in | Wt 217.4 lb

## 2023-08-03 DIAGNOSIS — R300 Dysuria: Secondary | ICD-10-CM | POA: Diagnosis not present

## 2023-08-03 DIAGNOSIS — Z125 Encounter for screening for malignant neoplasm of prostate: Secondary | ICD-10-CM | POA: Diagnosis not present

## 2023-08-03 NOTE — Progress Notes (Addendum)
   08/03/23 1:54 PM   Richard Walton 1961-01-21 [id number]  CC: Dysuria, PSA screening  HPI: Healthy 63 year old male who had an episode of some discomfort after urination back in April 2025.  This occurred after he was doing a fair amount of yard work and heavy lifting and very active.  Symptoms resolved spontaneously.  He did see his PCP on 06/19/2023 and dipstick urinalysis showed 1+ blood but was otherwise benign.  He was unable to urinate today for urinalysis.  He reports his symptoms have completely resolved and he denies any complaints today.  No history of gross hematuria.  PSA has been normal and stable, most recently 0.5.    PMH: Past Medical History:  Diagnosis Date   Colon polyp    Coronary artery disease    GERD (gastroesophageal reflux disease)    Hyperlipidemia    Hypertension    Seasonal allergies     Surgical History: Past Surgical History:  Procedure Laterality Date   ANKLE SURGERY     CLAVICLE SURGERY     COLONOSCOPY     RHINOPLASTY     ROTATOR CUFF REPAIR Bilateral     Family History: Family History  Problem Relation Age of Onset   Heart disease Mother    Kidney disease Father    Heart disease Brother     Physical Exam: BP (!) 158/91   Pulse 71   Ht 5' 10 (1.778 m)   Wt 217 lb 6 oz (98.6 kg)   BMI 31.19 kg/m    Constitutional:  Alert and oriented, No acute distress. Cardiovascular: No clubbing, cyanosis, or edema. Respiratory: Normal respiratory effort, no increased work of breathing. GI: Abdomen is soft, nontender, nondistended, no abdominal masses   Laboratory Data: Reviewed, see HPI  Assessment & Plan:   63 year old male with brief episode of discomfort with urination after very strenuous yard work, symptoms resolved spontaneously, dipstick urinalysis with PCP showed 1+ blood.  He was unable to void for urinalysis today.  I recommended repeating a microscopic urinalysis at some point either with us  or PCP to confirm no evidence of  microscopic hematuria.  Reassurance was provided regarding normal and stable PSA values.  He would like to follow-up as needed and return precautions were discussed at length.   Redell Burnet, MD 08/03/2023  [facility] [address], [address] [place], [place] [address] [phone]

## 2023-08-31 ENCOUNTER — Other Ambulatory Visit: Payer: Self-pay

## 2023-08-31 DIAGNOSIS — R7303 Prediabetes: Secondary | ICD-10-CM

## 2023-09-01 LAB — HGB A1C W/O EAG: Hgb A1c MFr Bld: 6.8 % — ABNORMAL HIGH (ref 4.8–5.6)

## 2023-09-06 ENCOUNTER — Ambulatory Visit: Payer: Self-pay | Admitting: Physician Assistant

## 2023-09-06 DIAGNOSIS — E0800 Diabetes mellitus due to underlying condition with hyperosmolarity without nonketotic hyperglycemic-hyperosmolar coma (NKHHC): Secondary | ICD-10-CM

## 2023-09-06 NOTE — Progress Notes (Signed)
   Subjective: Diabetes follow-up    Patient ID: MICKEY ESGUERRA, male    DOB: 1960-03-26, 63 y.o.   MRN: [id number]  HPI This is a telephonic encounter for Mr. Tamas Suen.  Patient requests results of hemoglobin A1c.  Patient last hemoglobin A1c 3 months ago was 7.7.  Patient hemoglobin A1c today is 6.8.  Patient continues to take metformin  500 mg twice daily.  Admits to intermittent compliance with food intake.  States overindulgence 2-3 times a week.   Review of Systems Diabetes, hyperlipidemia, and hypertension.    Objective:   Physical Exam Physical exam was deferred secondary to telephonic encounter.       Assessment & Plan: Diabetes   Advised to continue metformin  500 mg twice daily.  Monitor diet.  Follow-up in 3 months with hemoglobin A1c.

## 2023-11-14 ENCOUNTER — Other Ambulatory Visit: Payer: Self-pay

## 2023-11-14 DIAGNOSIS — Z889 Allergy status to unspecified drugs, medicaments and biological substances status: Secondary | ICD-10-CM

## 2023-11-14 MED ORDER — FLUTICASONE PROPIONATE 50 MCG/ACT NA SUSP: 2.0000 | Freq: Every day | NASAL | 6 refills | Status: AC

## 2023-12-07 ENCOUNTER — Other Ambulatory Visit: Payer: Self-pay

## 2023-12-07 DIAGNOSIS — R739 Hyperglycemia, unspecified: Secondary | ICD-10-CM

## 2023-12-07 NOTE — Progress Notes (Signed)
 Lab draw for repeat A1C without difficulty as ordered.

## 2023-12-08 LAB — HGB A1C W/O EAG: Hgb A1c MFr Bld: 6.4 % — ABNORMAL HIGH (ref 4.8–5.6)

## 2024-04-18 ENCOUNTER — Ambulatory Visit: Admit: 2024-04-18 | Admitting: Gastroenterology
